# Patient Record
Sex: Male | Born: 1990 | Race: Black or African American | Hispanic: No | Marital: Single | Smoking: Former smoker
Health system: Southern US, Community
[De-identification: ages and names within clinical notes are randomized; demographics above are authoritative.]

---
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
## 2014-11-20 ENCOUNTER — Emergency Department (INDEPENDENT_AMBULATORY_CARE_PROVIDER_SITE_OTHER)
Admission: EM | Admit: 2014-11-20 | Discharge: 2014-11-20 | Disposition: A | Discharge status: Home / Self Care | Payer: Self-pay | Source: Home / Self Care | Attending: Emergency Medicine | Admitting: Emergency Medicine

## 2014-11-20 ENCOUNTER — Encounter (HOSPITAL_COMMUNITY): Payer: Self-pay | Admitting: Emergency Medicine

## 2014-11-20 DIAGNOSIS — L0231 Cutaneous abscess of buttock: Secondary | ICD-10-CM

## 2014-11-20 MED ORDER — LIDOCAINE HCL 2 % IJ SOLN
INTRAMUSCULAR | Status: AC
  Filled 2014-11-20: qty 20

## 2014-11-20 MED ORDER — LIDOCAINE-EPINEPHRINE (PF) 2 %-1:200000 IJ SOLN
INTRAMUSCULAR | Status: AC
  Filled 2014-11-20: qty 20

## 2014-11-20 MED ORDER — DOXYCYCLINE HYCLATE 100 MG PO CAPS: 100.0000 mg | ORAL_CAPSULE | Freq: Two times a day (BID) | ORAL | Status: DC

## 2014-11-20 NOTE — Discharge Instructions (Signed)
Ibuprofen as directed on packaging for pain.  Abscess An abscess is an infected area that contains a collection of pus and debris.It can occur in almost any part of the body. An abscess is also known as a furuncle or boil. CAUSES  An abscess occurs when tissue gets infected. This can occur from blockage of oil or sweat glands, infection of hair follicles, or a minor injury to the skin. As the body tries to fight the infection, pus collects in the area and creates pressure under the skin. This pressure causes pain. People with weakened immune systems have difficulty fighting infections and get certain abscesses more often.  SYMPTOMS Usually an abscess develops on the skin and becomes a painful mass that is red, warm, and tender. If the abscess forms under the skin, you may feel a moveable soft area under the skin. Some abscesses break open (rupture) on their own, but most will continue to get worse without care. The infection can spread deeper into the body and eventually into the bloodstream, causing you to feel ill.  DIAGNOSIS  Your caregiver will take your medical history and perform a physical exam. A sample of fluid may also be taken from the abscess to determine what is causing your infection. TREATMENT  Your caregiver may prescribe antibiotic medicines to fight the infection. However, taking antibiotics alone usually does not cure an abscess. Your caregiver may need to make a small cut (incision) in the abscess to drain the pus. In some cases, gauze is packed into the abscess to reduce pain and to continue draining the area. HOME CARE INSTRUCTIONS   Only take over-the-counter or prescription medicines for pain, discomfort, or fever as directed by your caregiver.  If you were prescribed antibiotics, take them as directed. Finish them even if you start to feel better.  If gauze is used, follow your caregiver's directions for changing the gauze.  To avoid spreading the infection:  Keep your  draining abscess covered with a bandage.  Wash your hands well.  Do not share personal care items, towels, or whirlpools with others.  Avoid skin contact with others.  Keep your skin and clothes clean around the abscess.  Keep all follow-up appointments as directed by your caregiver. SEEK MEDICAL CARE IF:   You have increased pain, swelling, redness, fluid drainage, or bleeding.  You have muscle aches, chills, or a general ill feeling.  You have a fever. MAKE SURE YOU:   Understand these instructions.  Will watch your condition.  Will get help right away if you are not doing well or get worse. Document Released: 09/24/2005 Document Revised: 06/15/2012 Document Reviewed: 02/27/2012 [facility] Patient Information 2015 [place], [place]. This information is not intended to replace advice given to you by your health care provider. Make sure you discuss any questions you have with your health care provider.  Incision and Drainage Incision and drainage is a procedure in which a sac-like structure (cystic structure) is opened and drained. The area to be drained usually contains material such as pus, fluid, or blood.  LET YOUR CAREGIVER KNOW ABOUT:   Allergies to medicine.  Medicines taken, including vitamins, herbs, eyedrops, over-the-counter medicines, and creams.  Use of steroids (by mouth or creams).  Previous problems with anesthetics or numbing medicines.  History of bleeding problems or blood clots.  Previous surgery.  Other health problems, including diabetes and kidney problems.  Possibility of pregnancy, if this applies. RISKS AND COMPLICATIONS  Pain.  Bleeding.  Scarring.  Infection. BEFORE  THE PROCEDURE  You may need to have an ultrasound or other imaging tests to see how large or deep your cystic structure is. Blood tests may also be used to determine if you have an infection or how severe the infection is. You may need to have a tetanus shot. PROCEDURE    The affected area is cleaned with a cleaning fluid. The cyst area will then be numbed with a medicine (local anesthetic). A small incision will be made in the cystic structure. A syringe or catheter may be used to drain the contents of the cystic structure, or the contents may be squeezed out. The area will then be flushed with a cleansing solution. After cleansing the area, it is often gently packed with a gauze or another wound dressing. Once it is packed, it will be covered with gauze and tape or some other type of wound dressing. AFTER THE PROCEDURE   Often, you will be allowed to go home right after the procedure.  You may be given antibiotic medicine to prevent or heal an infection.  If the area was packed with gauze or some other wound dressing, you will likely need to come back in 1 to 2 days to get it removed.  The area should heal in about 14 days. Document Released: 06/10/2001 Document Revised: 06/15/2012 Document Reviewed: 02/09/2012 [facility]Exit[facility] Patient Information 2015 [place], [place]. This information is not intended to replace advice given to you by your health care provider. Make sure you discuss any questions you have with your health care provider.

## 2014-11-20 NOTE — ED Notes (Signed)
Pt states that he has a boil on right buttock. Pt states that its been there for 3 days

## 2014-11-20 NOTE — ED Provider Notes (Signed)
CSN: [id number]     Arrival date & time 11/20/14  1101 History   First MD Initiated Contact with Patient 11/20/14 1216     Chief Complaint  Patient presents with  . Recurrent Skin Infections   (Consider location/radiation/quality/duration/timing/severity/associated sxs/prior Treatment) HPI Comments: Abscess on right buttock that began 3-5 days ago. Hx of same in past. No previous I&D. No resolution with use of Boil Ease at home. Reports himself to be otherwise healthy. PCP: none  The history is provided by the patient.    History reviewed. No pertinent past medical history. History reviewed. No pertinent past surgical history. History reviewed. No pertinent family history. History  Substance Use Topics  . Smoking status: Current Every Day Smoker -- 0.50 packs/day    Types: Cigarettes  . Smokeless tobacco: Not on file  . Alcohol Use: Yes    Review of Systems  All other systems reviewed and are negative.   Allergies  Review of patient's allergies indicates not on file.  Home Medications   Prior to Admission medications   Medication Sig Start Date End Date Taking? Authorizing Provider  doxycycline (VIBRAMYCIN) 100 MG capsule Take 1 capsule (100 mg total) by mouth 2 (two) times daily. X 7 days 11/20/14   Jess BartersJennifer Lee H Jibri Schriefer, PA   BP 131/80 mmHg  Pulse 72  Temp(Src) 98.1 F (36.7 C) (Oral)  Resp 16  SpO2 100% Physical Exam  Constitutional: He is oriented to person, place, and time. He appears well-developed and well-nourished. No distress.  HENT:  Head: Normocephalic and atraumatic.  Eyes: Conjunctivae are normal.  Cardiovascular: Normal rate.   Pulmonary/Chest: Effort normal.  Musculoskeletal: Normal range of motion.  Neurological: He is alert and oriented to person, place, and time.  Skin: Skin is warm and dry.  Round 3x3cm abscess at right medial buttock  Psychiatric: He has a normal mood and affect. His behavior is normal.  Nursing note and vitals  reviewed.   ED Course  INCISION AND DRAINAGE Date/Time: 11/20/2014 1:05 PM Performed by: Lemmie EvensPRESSON, Sheran Newstrom LEE H Authorized by: Leslee HomeKELLER, DAVID C Consent: Verbal consent obtained. Written consent not obtained. Risks and benefits: risks, benefits and alternatives were discussed Consent given by: patient Patient understanding: patient states understanding of the procedure being performed Patient identity confirmed: verbally with patient Time out: Immediately prior to procedure a "time out" was called to verify the correct patient, procedure, equipment, support staff and site/side marked as required. Type: abscess Body area: anogenital (right medial buttock) Anesthesia: local infiltration Local anesthetic: lidocaine 2% with epinephrine Anesthetic total: 2 ml Patient sedated: no Scalpel size: 11 Incision type: single straight Complexity: simple Drainage: purulent Drainage amount: copious Wound treatment: wound left open Packing material: none Patient tolerance: Patient tolerated the procedure well with no immediate complications   (including critical care time) Labs Review Labs Reviewed - No data to display  Imaging Review No results found.   MDM   1. Abscess of buttock, right    Patient voices understanding of wound care at home. Will place on 7 day course of doxycycline and advise follow up if condition does not improve. Warm compresses of sitz baths 3-4 x day until healed    Ria ClockJennifer Lee H Leith Hedlund, PA 11/20/14 1312

## 2015-07-07 ENCOUNTER — Encounter (HOSPITAL_COMMUNITY): Payer: Self-pay | Admitting: *Deleted

## 2015-07-07 ENCOUNTER — Emergency Department (INDEPENDENT_AMBULATORY_CARE_PROVIDER_SITE_OTHER)
Admission: EM | Admit: 2015-07-07 | Discharge: 2015-07-07 | Disposition: A | Discharge status: Home / Self Care | Payer: Self-pay | Source: Home / Self Care | Attending: Family Medicine | Admitting: Family Medicine

## 2015-07-07 DIAGNOSIS — K529 Noninfective gastroenteritis and colitis, unspecified: Secondary | ICD-10-CM

## 2015-07-07 MED ORDER — ONDANSETRON HCL 4 MG PO TABS: 4.0000 mg | ORAL_TABLET | Freq: Four times a day (QID) | ORAL | Status: DC

## 2015-07-07 MED ORDER — ONDANSETRON HCL 4 MG/2ML IJ SOLN
4.0000 mg | Freq: Once | INTRAMUSCULAR | Status: AC
  Administered 2015-07-07: 4 mg via INTRAVENOUS

## 2015-07-07 MED ORDER — SODIUM CHLORIDE 0.9 % IV BOLUS (SEPSIS)
1000.0000 mL | Freq: Once | INTRAVENOUS | Status: AC
  Administered 2015-07-07: 1000 mL via INTRAVENOUS

## 2015-07-07 MED ORDER — ONDANSETRON HCL 4 MG/2ML IJ SOLN
INTRAMUSCULAR | Status: AC
  Filled 2015-07-07: qty 2

## 2015-07-07 NOTE — Discharge Instructions (Signed)
Clear liquid , bland diet tonight as tolerated, advance on sun as improved, use medicine as needed, return or see your doctor if any problems.

## 2015-07-07 NOTE — ED Provider Notes (Signed)
CSN: [id number]     Arrival date & time 07/07/15  1536 History   First MD Initiated Contact with Patient 07/07/15 1558     Chief Complaint  Patient presents with  . Emesis   (Consider location/radiation/quality/duration/timing/severity/associated sxs/prior Treatment) Patient is a 24 y.o. male presenting with vomiting. The history is provided by the patient.  Emesis Severity:  Moderate Duration:  1 day Timing:  Intermittent Quality:  Stomach contents Progression:  Unchanged Chronicity:  New Context comment:  Drank [place] and beer last eve without food. Relieved by:  Nothing Worsened by:  Nothing tried Ineffective treatments:  None tried Associated symptoms: diarrhea   Associated symptoms: no fever     History reviewed. No pertinent past medical history. History reviewed. No pertinent past surgical history. History reviewed. No pertinent family history. History  Substance Use Topics  . Smoking status: Current Every Day Smoker -- 0.50 packs/day    Types: Cigarettes  . Smokeless tobacco: Not on file  . Alcohol Use: Yes    Review of Systems  Constitutional: Positive for appetite change. Negative for fever.  Gastrointestinal: Positive for nausea, vomiting and diarrhea. Negative for blood in stool and rectal pain.    Allergies  Review of patient's allergies indicates no known allergies.  Home Medications   Prior to Admission medications   Medication Sig Start Date End Date Taking? Authorizing Provider  doxycycline (VIBRAMYCIN) 100 MG capsule Take 1 capsule (100 mg total) by mouth 2 (two) times daily. X 7 days 11/20/14   Mathis FareJennifer Lee H Presson, PA  ondansetron (ZOFRAN) 4 MG tablet Take 1 tablet (4 mg total) by mouth every 6 (six) hours. Prn n/v 07/07/15   Linna HoffJames D Liddy Deam, MD   Temp(Src) 98.6 F (37 C) (Oral)  SpO2 100% Physical Exam  Constitutional: He appears well-developed and well-nourished.  HENT:  Mouth/Throat: Oropharynx is clear and moist.  Neck: Normal range of  motion. Neck supple.  Pulmonary/Chest: Effort normal and breath sounds normal.  Abdominal: Soft. Bowel sounds are normal. He exhibits no distension and no mass. There is tenderness. There is no rebound and no guarding.  Lymphadenopathy:    He has no cervical adenopathy.  Skin: Skin is warm and dry.  Nursing note and vitals reviewed.   ED Course  Procedures (including critical care time) Labs Review Labs Reviewed - No data to display  Imaging Review No results found.   MDM   1. Gastroenteritis, acute    Sx improved after ivf and meds at time of d/c.    Linna HoffJames D Terrace Chiem, MD 07/07/15 [phone]

## 2015-07-07 NOTE — ED Notes (Signed)
Pt  Reports  Symptoms  Of  Nausea  /  Vomiting /  Diarrhea          -  With  Onset  Of  Symptoms        Middle  Of  Last  Pm     vomited  Just  Prior  To  Arriving  At the  Clinic     - pt  Is  Awake  And  Alert  Skin  Is warm /  Dry  -  Not  Actively  Vomiting at this  Time

## 2017-03-03 ENCOUNTER — Emergency Department (HOSPITAL_COMMUNITY): Payer: Self-pay

## 2017-03-03 ENCOUNTER — Emergency Department (HOSPITAL_COMMUNITY)
Admission: EM | Admit: 2017-03-03 | Discharge: 2017-03-04 | Disposition: A | Discharge status: Home / Self Care | Payer: Self-pay | Attending: Emergency Medicine | Admitting: Emergency Medicine

## 2017-03-03 ENCOUNTER — Encounter (HOSPITAL_COMMUNITY): Payer: Self-pay

## 2017-03-03 DIAGNOSIS — F17210 Nicotine dependence, cigarettes, uncomplicated: Secondary | ICD-10-CM | POA: Insufficient documentation

## 2017-03-03 DIAGNOSIS — IMO0002 Reserved for concepts with insufficient information to code with codable children: Secondary | ICD-10-CM

## 2017-03-03 DIAGNOSIS — N5089 Other specified disorders of the male genital organs: Secondary | ICD-10-CM

## 2017-03-03 DIAGNOSIS — N453 Epididymo-orchitis: Secondary | ICD-10-CM | POA: Insufficient documentation

## 2017-03-03 DIAGNOSIS — R229 Localized swelling, mass and lump, unspecified: Secondary | ICD-10-CM

## 2017-03-03 LAB — CBC
HEMATOCRIT: 40.6 % (ref 39.0–52.0)
Hemoglobin: 13.1 g/dL (ref 13.0–17.0)
MCH: 30.9 pg (ref 26.0–34.0)
MCHC: 32.3 g/dL (ref 30.0–36.0)
MCV: 95.8 fL (ref 78.0–100.0)
Platelets: 283 10*3/uL (ref 150–400)
RBC: 4.24 MIL/uL (ref 4.22–5.81)
RDW: 12.5 % (ref 11.5–15.5)
WBC: 14.1 10*3/uL — ABNORMAL HIGH (ref 4.0–10.5)

## 2017-03-03 LAB — URINALYSIS, ROUTINE W REFLEX MICROSCOPIC
Bilirubin Urine: NEGATIVE
Glucose, UA: NEGATIVE mg/dL
HGB URINE DIPSTICK: NEGATIVE
Ketones, ur: NEGATIVE mg/dL
NITRITE: NEGATIVE
PROTEIN: NEGATIVE mg/dL
SPECIFIC GRAVITY, URINE: 1.027 (ref 1.005–1.030)
pH: 5 (ref 5.0–8.0)

## 2017-03-03 LAB — COMPREHENSIVE METABOLIC PANEL
ALBUMIN: 4.2 g/dL (ref 3.5–5.0)
ALK PHOS: 66 U/L (ref 38–126)
ALT: 15 U/L — ABNORMAL LOW (ref 17–63)
AST: 21 U/L (ref 15–41)
Anion gap: 10 (ref 5–15)
BILIRUBIN TOTAL: 0.7 mg/dL (ref 0.3–1.2)
BUN: 10 mg/dL (ref 6–20)
CO2: 23 mmol/L (ref 22–32)
Calcium: 9.5 mg/dL (ref 8.9–10.3)
Chloride: 104 mmol/L (ref 101–111)
Creatinine, Ser: 0.98 mg/dL (ref 0.61–1.24)
GFR calc Af Amer: >60 mL/min (ref >60)
GFR calc non Af Amer: >60 mL/min (ref >60)
GLUCOSE: 83 mg/dL (ref 65–99)
POTASSIUM: 3.7 mmol/L (ref 3.5–5.1)
SODIUM: 137 mmol/L (ref 135–145)
TOTAL PROTEIN: 7.2 g/dL (ref 6.5–8.1)

## 2017-03-03 LAB — LIPASE, BLOOD: Lipase: 22 U/L (ref 11–51)

## 2017-03-03 MED ORDER — NAPROXEN 250 MG PO TABS
500.0000 mg | ORAL_TABLET | Freq: Once | ORAL | Status: AC
  Administered 2017-03-03: 500 mg via ORAL
  Filled 2017-03-03: qty 2

## 2017-03-03 MED ORDER — OXYCODONE-ACETAMINOPHEN 5-325 MG PO TABS
1.0000 | ORAL_TABLET | Freq: Once | ORAL | Status: AC
  Administered 2017-03-03: 1 via ORAL
  Filled 2017-03-03: qty 1

## 2017-03-03 NOTE — ED Triage Notes (Signed)
Onset 1 week ago pt jumped into seat of fathers PU truck and sat on right testicle, since then right testicle pain and swelling, and lower right abd pain.  Urinating WNl.

## 2017-03-03 NOTE — ED Notes (Signed)
Patient transported to Ultrasound 

## 2017-03-03 NOTE — ED Provider Notes (Signed)
[facility]-EMERGENCY DEPT Provider Note   CSN: [id number] Arrival date & time: 03/03/17  2020     History   Chief Complaint Chief Complaint  Patient presents with  . Testicle Pain  . Abdominal Pain    HPI Stephen Gutierrez is a 26 y.o. male.  HPI Presents after an injury to his right testicle States he was going into his father's truck when he sat on to his testicle abruptly and hurt it He noted that it has been more swollen than usual since He denies that the pain was present prior to this But since then, he has had significant pain in his right testicle and this has now started to radiate up his right upper quadrant He is not vomiting, not nauseous He denies any fevers, chills He is otherwise healthy He attempted over-the-counter medications including Tylenol, NSAIDs, warm bath but does not feel significant improvement in the pain continues The pain is constant, severe He feels like his right testicle is swollen He states he is sexually active but has not had in her course in 3 weeks and he has the same partner He has had STIs but this was when he was a teenager Denies urethral discharge  History reviewed. No pertinent past medical history.  There are no active problems to display for this patient.   History reviewed. No pertinent surgical history.     Home Medications    Prior to Admission medications   Medication Sig Start Date End Date Taking? Authorizing Provider  doxycycline (VIBRAMYCIN) 100 MG capsule Take 1 capsule (100 mg total) by mouth 2 (two) times daily. X 7 days 11/20/14   Mathis Fare Presson, PA  ondansetron (ZOFRAN) 4 MG tablet Take 1 tablet (4 mg total) by mouth every 6 (six) hours. Prn n/v 07/07/15   Linna Hoff, MD    Family History History reviewed. No pertinent family history.  Social History Social History  Substance Use Topics  . Smoking status: Current Every Day Smoker    Packs/day: 0.25    Types: Cigarettes  . Smokeless tobacco: Never  Used  . Alcohol use Yes     Allergies   Patient has no known allergies.   Review of Systems Review of Systems  Constitutional: Negative for fever.  Allergic/Immunologic: Negative for immunocompromised state.  All other systems reviewed and are negative.    Physical Exam Updated Vital Signs BP 137/95   Pulse 90   Temp 99.1 F (37.3 C) (Oral)   Resp 16   Ht 5\' 10"  (1.778 m)   Wt 59 kg   SpO2 99%   BMI 18.65 kg/m   Physical Exam  Constitutional: He appears well-developed and well-nourished. No distress.  HENT:  Head: Normocephalic and atraumatic.  Eyes: Conjunctivae are normal. Pupils are equal, round, and reactive to light. Right eye exhibits no discharge. Left eye exhibits no discharge.  Neck: Normal range of motion. Neck supple.  Cardiovascular: Normal rate and regular rhythm.   No murmur heard. Pulmonary/Chest: Effort normal and breath sounds normal. No respiratory distress.  Abdominal: Soft. Bowel sounds are normal. He exhibits no distension and no mass. There is no tenderness. There is no rebound and no guarding. A hernia is present.  Genitourinary: No penile tenderness.  Genitourinary Comments: Right testicle is significantly larger then left, tender without fluctuant mass Pos cremasteric reflex bilaterally Minimal inguinal hernia bilaterally that is fully reducible without tenderness  Musculoskeletal: He exhibits no edema.  Neurological: He is alert.  Skin: Skin is  warm. He is not diaphoretic.  Psychiatric: He has a normal mood and affect.  Nursing note and vitals reviewed.  ED Treatments / Results  Labs (all labs ordered are listed, but only abnormal results are displayed) Labs Reviewed  COMPREHENSIVE METABOLIC PANEL - Abnormal; Notable for the following:       Result Value   ALT 15 (*)    All other components within normal limits  CBC - Abnormal; Notable for the following:    WBC 14.1 (*)    All other components within normal limits  URINALYSIS,  ROUTINE W REFLEX MICROSCOPIC - Abnormal; Notable for the following:    APPearance HAZY (*)    Leukocytes, UA MODERATE (*)    Bacteria, UA RARE (*)    Squamous Epithelial / LPF 0-5 (*)    All other components within normal limits  URINE CULTURE  LIPASE, BLOOD  GC/CHLAMYDIA PROBE AMP ([place]) NOT AT [facility]    EKG  EKG Interpretation None       Radiology US Scrotum  Result Date: 03/04/2017 CLINICAL DATA:  Right testicular pain and swelling for 1 week EXAM: SCROTAL ULTRASOUND DOPPLER ULTRASOUND OF THE TESTICLES TECHNIQUE: Complete ultrasound examination of the testicles, epididymis, and other scrotal structures was performed. Color and spectral Doppler ultrasound were also utilized to evaluate blood flow to the testicles. COMPARISON:  None. FINDINGS: Right testicle Measurements: 4.1 x 2.9 x 3.0 cm. No mass or microlithiasis visualized. Left testicle Measurements: 4.0 x 2.2 x 2.8 cm. No mass or microlithiasis visualized. Right epididymis: Edematous without focal lesion. Markedly hyperemic on Doppler. Left epididymis: Normal in size and appearance. Incidental 3 mm cyst. Hydrocele:  Moderately large mildly complex right hydrocele. Varicocele:  None visualized. Pulsed Doppler interrogation of both testes demonstrates marked hypervascularity of the right testis and right epididymis. Normal perfusion on the left. IMPRESSION: Marked hypervascularity of the right testis and right epididymis, consistent with epididymo-orchitis. Mildly complex moderately large right hydrocele. No evidence of testicular mass or torsion. Electronically Signed   By: Ellery Plunk M.D.   On: 03/04/2017 00:12   [place] Art/ven Flow Abd Pelv Doppler  Result Date: 03/04/2017 CLINICAL DATA:  Right testicular pain and swelling for 1 week EXAM: SCROTAL ULTRASOUND DOPPLER ULTRASOUND OF THE TESTICLES TECHNIQUE: Complete ultrasound examination of the testicles, epididymis, and other scrotal structures was performed. Color and spectral  Doppler ultrasound were also utilized to evaluate blood flow to the testicles. COMPARISON:  None. FINDINGS: Right testicle Measurements: 4.1 x 2.9 x 3.0 cm. No mass or microlithiasis visualized. Left testicle Measurements: 4.0 x 2.2 x 2.8 cm. No mass or microlithiasis visualized. Right epididymis: Edematous without focal lesion. Markedly hyperemic on Doppler. Left epididymis: Normal in size and appearance. Incidental 3 mm cyst. Hydrocele:  Moderately large mildly complex right hydrocele. Varicocele:  None visualized. Pulsed Doppler interrogation of both testes demonstrates marked hypervascularity of the right testis and right epididymis. Normal perfusion on the left. IMPRESSION: Marked hypervascularity of the right testis and right epididymis, consistent with epididymo-orchitis. Mildly complex moderately large right hydrocele. No evidence of testicular mass or torsion. Electronically Signed   By: Ellery Plunk M.D.   On: 03/04/2017 00:12    Procedures Procedures (including critical care time)  Medications Ordered in ED Medications  oxyCODONE-acetaminophen (PERCOCET/ROXICET) 5-325 MG per tablet 1 tablet (1 tablet Oral Given 03/03/17 2252)  naproxen (NAPROSYN) tablet 500 mg (500 mg Oral Given 03/03/17 2252)  acetaminophen (TYLENOL) tablet 650 mg (650 mg Oral Given 03/04/17 0134)  oxyCODONE (Oxy  IR/ROXICODONE) immediate release tablet 5 mg (5 mg Oral Given 03/04/17 0134)     Initial Impression / Assessment and Plan / ED Course  I have reviewed the triage vital signs and the nursing notes.  Pertinent labs & imaging results that were available during my care of the patient were reviewed by me and considered in my medical decision making (see chart for details).     Ultrasound reveals epididymal orchitis the patient has a white count with some mild leukocytes on urinalysis.  Patient's left lower quadrant and right lower quadrant abdomen are benign and patient has no evidence of a incarcerated hernia.   Patient is not high risk for STDs but culture sent.  Discussed with urologist, Dr.Herrck, who recommends ice, high-dose ibuprofen scheduled over 2 weeks and as needed follow-up with urology.  Discussed with patient diagnosis, treatment, return precautions and he verbalizes understanding and agreement with plan.  Patient discharged in good condition.  Final Clinical Impressions(s) / ED Diagnoses   Final diagnoses:  Mass  Testicular swelling  Orchitis and epididymitis    New Prescriptions New Prescriptions   No medications on file     Sidney AceAlison Charruf Sharmane Dame, MD 03/04/17 0149    Laurence Spatesachel Morgan Little, MD 03/06/17 1400

## 2017-03-04 MED ORDER — IBUPROFEN 800 MG PO TABS: 800.0000 mg | ORAL_TABLET | Freq: Three times a day (TID) | ORAL | 0 refills | Status: DC

## 2017-03-04 MED ORDER — OXYCODONE HCL 5 MG PO TABS
5.0000 mg | ORAL_TABLET | Freq: Once | ORAL | Status: AC
  Administered 2017-03-04: 5 mg via ORAL
  Filled 2017-03-04: qty 1

## 2017-03-04 MED ORDER — ACETAMINOPHEN 325 MG PO TABS
650.0000 mg | ORAL_TABLET | Freq: Once | ORAL | Status: AC
  Administered 2017-03-04: 650 mg via ORAL
  Filled 2017-03-04: qty 2

## 2017-03-04 NOTE — ED Notes (Signed)
EDP at bedside  

## 2017-03-04 NOTE — ED Notes (Signed)
Pt requesting pain medication and coke to drink. Alona BeneJoyce, RN notified.

## 2017-03-05 LAB — URINE CULTURE

## 2017-12-03 ENCOUNTER — Encounter (HOSPITAL_COMMUNITY): Payer: Self-pay | Admitting: Emergency Medicine

## 2017-12-03 ENCOUNTER — Emergency Department (HOSPITAL_COMMUNITY)
Admission: EM | Admit: 2017-12-03 | Discharge: 2017-12-03 | Disposition: A | Discharge status: Home / Self Care | Payer: Self-pay | Attending: Emergency Medicine | Admitting: Emergency Medicine

## 2017-12-03 ENCOUNTER — Other Ambulatory Visit: Payer: Self-pay

## 2017-12-03 DIAGNOSIS — F1910 Other psychoactive substance abuse, uncomplicated: Secondary | ICD-10-CM

## 2017-12-03 DIAGNOSIS — T43641A Poisoning by ecstasy, accidental (unintentional), initial encounter: Secondary | ICD-10-CM | POA: Insufficient documentation

## 2017-12-03 DIAGNOSIS — F1210 Cannabis abuse, uncomplicated: Secondary | ICD-10-CM | POA: Insufficient documentation

## 2017-12-03 DIAGNOSIS — F17210 Nicotine dependence, cigarettes, uncomplicated: Secondary | ICD-10-CM | POA: Insufficient documentation

## 2017-12-03 LAB — CBC WITH DIFFERENTIAL/PLATELET
Basophils Absolute: 0 10*3/uL (ref 0.0–0.1)
Basophils Relative: 0 %
EOS PCT: 0 %
Eosinophils Absolute: 0 10*3/uL (ref 0.0–0.7)
HCT: 40 % (ref 39.0–52.0)
Hemoglobin: 13 g/dL (ref 13.0–17.0)
LYMPHS ABS: 1.6 10*3/uL (ref 0.7–4.0)
LYMPHS PCT: 18 %
MCH: 31.8 pg (ref 26.0–34.0)
MCHC: 32.5 g/dL (ref 30.0–36.0)
MCV: 97.8 fL (ref 78.0–100.0)
MONO ABS: 0.8 10*3/uL (ref 0.1–1.0)
MONOS PCT: 9 %
Neutro Abs: 6.4 10*3/uL (ref 1.7–7.7)
Neutrophils Relative %: 73 %
PLATELETS: 298 10*3/uL (ref 150–400)
RBC: 4.09 MIL/uL — ABNORMAL LOW (ref 4.22–5.81)
RDW: 13.6 % (ref 11.5–15.5)
WBC: 8.8 10*3/uL (ref 4.0–10.5)

## 2017-12-03 LAB — COMPREHENSIVE METABOLIC PANEL
ALT: 20 U/L (ref 17–63)
AST: 29 U/L (ref 15–41)
Albumin: 3.6 g/dL (ref 3.5–5.0)
Alkaline Phosphatase: 69 U/L (ref 38–126)
Anion gap: 5 (ref 5–15)
BILIRUBIN TOTAL: 0.6 mg/dL (ref 0.3–1.2)
BUN: 10 mg/dL (ref 6–20)
CALCIUM: 8.9 mg/dL (ref 8.9–10.3)
CHLORIDE: 107 mmol/L (ref 101–111)
CO2: 27 mmol/L (ref 22–32)
CREATININE: 0.89 mg/dL (ref 0.61–1.24)
Glucose, Bld: 103 mg/dL — ABNORMAL HIGH (ref 65–99)
POTASSIUM: 3.8 mmol/L (ref 3.5–5.1)
Sodium: 139 mmol/L (ref 135–145)
TOTAL PROTEIN: 6.2 g/dL — AB (ref 6.5–8.1)

## 2017-12-03 LAB — ACETAMINOPHEN LEVEL: Acetaminophen (Tylenol), Serum: <10 ug/mL — ABNORMAL LOW (ref 10–30)

## 2017-12-03 LAB — SALICYLATE LEVEL

## 2017-12-03 LAB — ETHANOL

## 2017-12-03 NOTE — ED Triage Notes (Signed)
Per EMS-states he and his roommate took Ectasy around 9 pm last night-patient was uncooperative with EMS-5 mg of Versed given in right deltoid-able to follow simple commands at this time

## 2017-12-03 NOTE — ED Notes (Signed)
Bed: [facility] Expected date:  Expected time:  Means of arrival:  Comments: 
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
## 2017-12-03 NOTE — Discharge Instructions (Signed)
Don't do Ecstasy again.  Increase hydration.   Return to ER for new or worsening symptoms, any additional concerns.

## 2019-01-12 ENCOUNTER — Emergency Department (HOSPITAL_COMMUNITY)
Admission: EM | Admit: 2019-01-12 | Discharge: 2019-01-12 | Disposition: A | Discharge status: Home / Self Care | Payer: Self-pay | Attending: Emergency Medicine | Admitting: Emergency Medicine

## 2019-01-12 ENCOUNTER — Emergency Department (HOSPITAL_COMMUNITY): Payer: Self-pay

## 2019-01-12 DIAGNOSIS — S0101XA Laceration without foreign body of scalp, initial encounter: Secondary | ICD-10-CM | POA: Insufficient documentation

## 2019-01-12 DIAGNOSIS — S0990XA Unspecified injury of head, initial encounter: Secondary | ICD-10-CM

## 2019-01-12 DIAGNOSIS — F17210 Nicotine dependence, cigarettes, uncomplicated: Secondary | ICD-10-CM | POA: Insufficient documentation

## 2019-01-12 DIAGNOSIS — S022XXA Fracture of nasal bones, initial encounter for closed fracture: Secondary | ICD-10-CM | POA: Insufficient documentation

## 2019-01-12 DIAGNOSIS — Y939 Activity, unspecified: Secondary | ICD-10-CM | POA: Insufficient documentation

## 2019-01-12 DIAGNOSIS — Y998 Other external cause status: Secondary | ICD-10-CM | POA: Insufficient documentation

## 2019-01-12 DIAGNOSIS — Y929 Unspecified place or not applicable: Secondary | ICD-10-CM | POA: Insufficient documentation

## 2019-01-12 MED ORDER — HYDROCODONE-ACETAMINOPHEN 5-325 MG PO TABS: 1.0000 | ORAL_TABLET | ORAL | 0 refills | Status: DC | PRN

## 2019-01-12 MED ORDER — FLUORESCEIN SODIUM 1 MG OP STRP
1.0000 | ORAL_STRIP | Freq: Once | OPHTHALMIC | Status: AC
  Administered 2019-01-12: 1 via OPHTHALMIC
  Filled 2019-01-12: qty 1

## 2019-01-12 MED ORDER — TETRACAINE HCL 0.5 % OP SOLN
1.0000 [drp] | Freq: Once | OPHTHALMIC | Status: AC
  Administered 2019-01-12: 2 [drp] via OPHTHALMIC
  Filled 2019-01-12: qty 4
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
## 2019-01-12 NOTE — Discharge Instructions (Signed)
Please return for any problem.  Follow-up with your regular care providers as instructed.  The staple in your scalp will need to be removed in 5 to 7 days.  You can return to the ED for removal.

## 2019-01-12 NOTE — ED Notes (Signed)
EDP at bedside for eye exam.  

## 2019-01-17 ENCOUNTER — Emergency Department (HOSPITAL_COMMUNITY)
Admission: EM | Admit: 2019-01-17 | Discharge: 2019-01-17 | Disposition: A | Discharge status: Home / Self Care | Payer: Self-pay | Attending: Emergency Medicine | Admitting: Emergency Medicine

## 2019-01-17 DIAGNOSIS — Z5189 Encounter for other specified aftercare: Secondary | ICD-10-CM

## 2019-01-17 DIAGNOSIS — F17210 Nicotine dependence, cigarettes, uncomplicated: Secondary | ICD-10-CM | POA: Insufficient documentation

## 2019-01-17 DIAGNOSIS — Z4802 Encounter for removal of sutures: Secondary | ICD-10-CM

## 2019-01-17 NOTE — Discharge Instructions (Addendum)
You have been seen today for a wound check. Please read and follow all provided instructions.   1. Medications: usual home medications 2. Treatment: rest, drink plenty of fluids 3. Follow Up: Please follow up with your primary doctor in 5-7 days for discussion of your diagnoses and further evaluation after today's visit; if you do not have a primary care doctor use the resource guide provided to find one; Please return to the ER for any new or worsening symptoms. Please obtain all of your results from medical records or have your doctors office obtain the results - share them with your doctor - you should be seen at your doctors office. Call today to arrange your follow up.   Take medications as prescribed. Please review all of the medicines and only take them if you do not have an allergy to them. Return to the emergency room for worsening condition or new concerning symptoms. Follow up with your regular doctor. If you don't have a regular doctor use one of the numbers below to establish a primary care doctor.  Please be aware that if you are taking birth control pills, taking other prescriptions, ESPECIALLY ANTIBIOTICS may make the birth control ineffective - if this is the case, either do not engage in sexual activity or use alternative methods of birth control such as condoms until you have finished the medicine and your family doctor says it is OK to restart them. If you are on a blood thinner such as COUMADIN, be aware that any other medicine that you take may cause the coumadin to either work too much, or not enough - you should have your coumadin level rechecked in next 7 days if this is the case.  ?  It is also a possibility that you have an allergic reaction to any of the medicines that you have been prescribed - Everybody reacts differently to medications and while MOST people have no trouble with most medicines, you may have a reaction such as nausea, vomiting, rash, swelling, shortness of  breath. If this is the case, please stop taking the medicine immediately and contact your physician.  ?  You should return to the ER if you develop severe or worsening symptoms.   Emergency Department Resource Guide 1) Find a Doctor and Pay Out of Pocket Although you won't have to find out who is covered by your insurance plan, it is a good idea to ask around and get recommendations. You will then need to call the office and see if the doctor you have chosen will accept you as a new patient and what types of options they offer for patients who are self-pay. Some doctors offer discounts or will set up payment plans for their patients who do not have insurance, but you will need to ask so you aren't surprised when you get to your appointment.  2) Contact Your Local Health Department Not all health departments have doctors that can see patients for sick visits, but many do, so it is worth a call to see if yours does. If you don't know where your local health department is, you can check in your phone book. The CDC also has a tool to help you locate your state's health department, and many state websites also have listings of all of their local health departments.  3) Find a Walk-in Clinic If your illness is not likely to be very severe or complicated, you may want to try a walk in clinic. These are popping up all  over the country in pharmacies, drugstores, and shopping centers. They're usually staffed by nurse practitioners or physician assistants that have been trained to treat common illnesses and complaints. They're usually fairly quick and inexpensive. However, if you have serious medical issues or chronic medical problems, these are probably not your best option.  No Primary Care Doctor: Call Health Connect at  [phone] - they can help you locate a primary care doctor that  accepts your insurance, provides certain services, etc. Physician Referral [phone]  Emergency Department  Resource Guide 1) Find a Doctor and Pay Out of Pocket Although you won't have to find out who is covered by your insurance plan, it is a good idea to ask around and get recommendations. You will then need to call the office and see if the doctor you have chosen will accept you as a new patient and what types of options they offer for patients who are self-pay. Some doctors offer discounts or will set up payment plans for their patients who do not have insurance, but you will need to ask so you aren't surprised when you get to your appointment.  2) Contact Your Local Health Department Not all health departments have doctors that can see patients for sick visits, but many do, so it is worth a call to see if yours does. If you don't know where your local health department is, you can check in your phone book. The CDC also has a tool to help you locate your state's health department, and many state websites also have listings of all of their local health departments.  3) Find a [facility] If your illness is not likely to be very severe or complicated, you may want to try a walk in clinic. These are popping up all over the country in pharmacies, drugstores, and shopping centers. They're usually staffed by nurse practitioners or physician assistants that have been trained to treat common illnesses and complaints. They're usually fairly quick and inexpensive. However, if you have serious medical issues or chronic medical problems, these are probably not your best option.  No Primary Care Doctor: Call Health Connect at  [phone] - they can help you locate a primary care doctor that  accepts your insurance, provides certain services, etc. Physician Referral Service- [phone]  Chronic Pain Problems: Organization         Address  Phone   Notes  [facility]  [phone] Patients need to be referred by their primary care doctor.   Medication Assistance: Organization          Address  Phone   Notes  [facility] [address] [place], [place] [address] [phone] --Must be a resident of [facility] -- Must have NO insurance coverage whatsoever (no Medicaid/ Medicare, etc.) -- The pt. MUST have a primary care doctor that directs their care regularly and follows them in the community   [facility]  [phone]   [employer]  [phone]    Agencies that provide inexpensive medical care: Organization         Address  Phone   Notes  [place]  [phone]   [facility]    [phone]   [facility] [place], [place] [address] [phone]   [place] [address] [place] [phone]   [facility]anned Parenthood    [phone]  [facility]    [phone]   [facility] and [facility]  [address], [place] Phone:  [phone], Fax:  [phone] Hours of Operation:  9 am - 6 pm, M-F.  Also accepts Medicaid/Medicare and self-pay.  [facility] for [place] [address], [address], [place] Phone: [phone], Fax: [phone]. Hours of Operation:  8:30 am - 5:30 pm, M-F.  Also accepts Medicaid and self-pay.  [facility] [address] [place] Phone: [phone]   [place], [place], [place] [phone], Ext. [phone] Mondays & Thursdays: 7-9 AM.  First 15 patients are seen on a first come, first serve basis.    [place] Providers:  Organization         Address  Phone   Notes  [facility] [address], [place] [phone] Also accepts self-pay patients.  [facility] [address], [place]  [phone]   [place], [address], [place] [phone]   [facility] [address] [place] [phone]   Lucianne Lei [address], [place]   [phone] Only accepts [place] [facility] [place] patients after they have their name applied to their card.   Self-Pay (no insurance) in [facility]:  Organization         Address  Phone   Notes  Sickle Cell Patients, [facility] Internal [facility] Protection [phone]   [facility] [place] [phone]   [facility] [place]  [place], [place], [place] [phone]   [facility]/Dr. Osei-Bonsu  [address] [place] or [address], [place] [phone] Phone number for both [place] and [place] locations is the same.  [facility] and [facility] [address] [place] [phone]   [facility] [address], [place] or [address] [phone]   [facility] [address], [place] [phone], phone; [phone], fax Sees patients 1st and 3rd Saturday of every month.  Must not qualify for public or private insurance (i.e. Medicaid, Medicare,  Health Choice, Veterans' Benefits)  Household income should be no more than 200% of the poverty level The clinic cannot treat you if you are pregnant or think you are pregnant  Sexually transmitted diseases are not treated at the clinic.
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
## 2019-01-17 NOTE — ED Triage Notes (Signed)
Pt arrives to have one staple removed from right side of head.

## 2019-05-21 ENCOUNTER — Emergency Department (HOSPITAL_COMMUNITY): Payer: Self-pay

## 2019-05-21 ENCOUNTER — Encounter (HOSPITAL_COMMUNITY): Payer: Self-pay | Admitting: Emergency Medicine

## 2019-05-21 ENCOUNTER — Emergency Department (HOSPITAL_COMMUNITY): Payer: Self-pay | Admitting: Certified Registered Nurse Anesthetist

## 2019-05-21 ENCOUNTER — Inpatient Hospital Stay (HOSPITAL_COMMUNITY)
Admission: EM | Admit: 2019-05-21 | Discharge: 2019-05-22 | DRG: 030 | Disposition: A | Discharge status: Home / Self Care | Payer: Self-pay | Attending: Neurosurgery | Admitting: Neurosurgery

## 2019-05-21 ENCOUNTER — Other Ambulatory Visit: Payer: Self-pay

## 2019-05-21 ENCOUNTER — Inpatient Hospital Stay (HOSPITAL_COMMUNITY): Payer: Self-pay

## 2019-05-21 ENCOUNTER — Encounter (HOSPITAL_COMMUNITY)
Admission: EM | Disposition: A | Discharge status: Home / Self Care | Payer: Self-pay | Source: Home / Self Care | Attending: Neurosurgery

## 2019-05-21 DIAGNOSIS — F17210 Nicotine dependence, cigarettes, uncomplicated: Secondary | ICD-10-CM | POA: Diagnosis present

## 2019-05-21 DIAGNOSIS — S14155A Other incomplete lesion at C5 level of cervical spinal cord, initial encounter: Principal | ICD-10-CM | POA: Diagnosis present

## 2019-05-21 DIAGNOSIS — M4842XA Fatigue fracture of vertebra, cervical region, initial encounter for fracture: Secondary | ICD-10-CM | POA: Diagnosis present

## 2019-05-21 DIAGNOSIS — Z419 Encounter for procedure for purposes other than remedying health state, unspecified: Secondary | ICD-10-CM

## 2019-05-21 DIAGNOSIS — M532X2 Spinal instabilities, cervical region: Secondary | ICD-10-CM

## 2019-05-21 DIAGNOSIS — M542 Cervicalgia: Secondary | ICD-10-CM

## 2019-05-21 DIAGNOSIS — S14109A Unspecified injury at unspecified level of cervical spinal cord, initial encounter: Secondary | ICD-10-CM | POA: Diagnosis present

## 2019-05-21 DIAGNOSIS — Z20828 Contact with and (suspected) exposure to other viral communicable diseases: Secondary | ICD-10-CM | POA: Diagnosis present

## 2019-05-21 DIAGNOSIS — M25572 Pain in left ankle and joints of left foot: Secondary | ICD-10-CM

## 2019-05-21 DIAGNOSIS — S12400A Unspecified displaced fracture of fifth cervical vertebra, initial encounter for closed fracture: Secondary | ICD-10-CM | POA: Diagnosis present

## 2019-05-21 DIAGNOSIS — S0990XA Unspecified injury of head, initial encounter: Secondary | ICD-10-CM

## 2019-05-21 HISTORY — PX: ANTERIOR CERVICAL DECOMP/DISCECTOMY FUSION: SHX1161

## 2019-05-21 LAB — COMPREHENSIVE METABOLIC PANEL
ALT: 21 U/L (ref 0–44)
AST: 39 U/L (ref 15–41)
Albumin: 5 g/dL (ref 3.5–5.0)
Alkaline Phosphatase: 57 U/L (ref 38–126)
Anion gap: 10 (ref 5–15)
BUN: 6 mg/dL (ref 6–20)
CO2: 25 mmol/L (ref 22–32)
Calcium: 9.7 mg/dL (ref 8.9–10.3)
Chloride: 102 mmol/L (ref 98–111)
Creatinine, Ser: 1.05 mg/dL (ref 0.61–1.24)
GFR calc Af Amer: >60 mL/min (ref >60)
GFR calc non Af Amer: >60 mL/min (ref >60)
Glucose, Bld: 93 mg/dL (ref 70–99)
Potassium: 4.1 mmol/L (ref 3.5–5.1)
Sodium: 137 mmol/L (ref 135–145)
Total Bilirubin: 0.9 mg/dL (ref 0.3–1.2)
Total Protein: 8.2 g/dL — ABNORMAL HIGH (ref 6.5–8.1)

## 2019-05-21 LAB — SARS CORONAVIRUS 2 BY RT PCR (HOSPITAL ORDER, PERFORMED IN ~~LOC~~ HOSPITAL LAB): SARS Coronavirus 2: NEGATIVE

## 2019-05-21 LAB — CBC WITH DIFFERENTIAL/PLATELET
Abs Immature Granulocytes: 0.11 10*3/uL — ABNORMAL HIGH (ref 0.00–0.07)
Basophils Absolute: 0 10*3/uL (ref 0.0–0.1)
Basophils Relative: 0 %
Eosinophils Absolute: 0 10*3/uL (ref 0.0–0.5)
Eosinophils Relative: 0 %
HCT: 43.7 % (ref 39.0–52.0)
Hemoglobin: 14.4 g/dL (ref 13.0–17.0)
Immature Granulocytes: 1 %
Lymphocytes Relative: 5 %
Lymphs Abs: 0.8 10*3/uL (ref 0.7–4.0)
MCH: 32.1 pg (ref 26.0–34.0)
MCHC: 33 g/dL (ref 30.0–36.0)
MCV: 97.3 fL (ref 80.0–100.0)
Monocytes Absolute: 1 10*3/uL (ref 0.1–1.0)
Monocytes Relative: 5 %
Neutro Abs: 16.9 10*3/uL — ABNORMAL HIGH (ref 1.7–7.7)
Neutrophils Relative %: 89 %
Platelets: 223 10*3/uL (ref 150–400)
RBC: 4.49 MIL/uL (ref 4.22–5.81)
RDW: 12.5 % (ref 11.5–15.5)
WBC: 18.9 10*3/uL — ABNORMAL HIGH (ref 4.0–10.5)
nRBC: 0 % (ref 0.0–0.2)

## 2019-05-21 LAB — ABO/RH: ABO/RH(D): O POS

## 2019-05-21 LAB — PROTIME-INR
INR: 1.1 (ref 0.8–1.2)
Prothrombin Time: 14.1 seconds (ref 11.4–15.2)

## 2019-05-21 LAB — TYPE AND SCREEN
ABO/RH(D): O POS
Antibody Screen: NEGATIVE

## 2019-05-21 LAB — ETHANOL: Alcohol, Ethyl (B): <10 mg/dL (ref <10)

## 2019-05-21 SURGERY — ANTERIOR CERVICAL DECOMPRESSION/DISCECTOMY FUSION 1 LEVEL
Anesthesia: General | Site: Neck

## 2019-05-21 MED ORDER — FENTANYL CITRATE (PF) 250 MCG/5ML IJ SOLN
INTRAMUSCULAR | Status: AC
  Filled 2019-05-21: qty 5

## 2019-05-21 MED ORDER — ROCURONIUM BROMIDE 50 MG/5ML IV SOSY
PREFILLED_SYRINGE | INTRAVENOUS | Status: DC | PRN
  Administered 2019-05-21: 50 mg via INTRAVENOUS

## 2019-05-21 MED ORDER — PROMETHAZINE HCL 25 MG/ML IJ SOLN: 6.2500 mg | INTRAMUSCULAR | Status: DC | PRN

## 2019-05-21 MED ORDER — FENTANYL CITRATE (PF) 100 MCG/2ML IJ SOLN: 25.0000 ug | INTRAMUSCULAR | Status: DC | PRN

## 2019-05-21 MED ORDER — ACETAMINOPHEN 10 MG/ML IV SOLN
INTRAVENOUS | Status: AC
  Filled 2019-05-21: qty 100

## 2019-05-21 MED ORDER — SUGAMMADEX SODIUM 200 MG/2ML IV SOLN
INTRAVENOUS | Status: DC | PRN
  Administered 2019-05-21: 200 mg via INTRAVENOUS

## 2019-05-21 MED ORDER — THROMBIN 5000 UNITS EX SOLR
OROMUCOSAL | Status: DC | PRN
  Administered 2019-05-21: 22:00:00 5 mL via TOPICAL

## 2019-05-21 MED ORDER — CYCLOBENZAPRINE HCL 10 MG PO TABS
10.0000 mg | ORAL_TABLET | Freq: Three times a day (TID) | ORAL | Status: DC | PRN
  Administered 2019-05-22: 05:00:00 10 mg via ORAL
  Filled 2019-05-21: qty 1

## 2019-05-21 MED ORDER — ACETAMINOPHEN 325 MG PO TABS: 650.0000 mg | ORAL_TABLET | ORAL | Status: DC | PRN

## 2019-05-21 MED ORDER — SODIUM CHLORIDE 0.9% FLUSH
3.0000 mL | Freq: Two times a day (BID) | INTRAVENOUS | Status: DC
  Administered 2019-05-22 (×2): 3 mL via INTRAVENOUS

## 2019-05-21 MED ORDER — 0.9 % SODIUM CHLORIDE (POUR BTL) OPTIME
TOPICAL | Status: DC | PRN
  Administered 2019-05-21: 22:00:00 1000 mL

## 2019-05-21 MED ORDER — MENTHOL 3 MG MT LOZG: 1.0000 | LOZENGE | OROMUCOSAL | Status: DC | PRN

## 2019-05-21 MED ORDER — SUCCINYLCHOLINE CHLORIDE 20 MG/ML IJ SOLN
INTRAMUSCULAR | Status: DC | PRN
  Administered 2019-05-21: 120 mg via INTRAVENOUS

## 2019-05-21 MED ORDER — DIPHENHYDRAMINE HCL 50 MG/ML IJ SOLN
INTRAMUSCULAR | Status: DC | PRN
  Administered 2019-05-21: 12.5 mg via INTRAVENOUS

## 2019-05-21 MED ORDER — HYDROCODONE-ACETAMINOPHEN 5-325 MG PO TABS: 1.0000 | ORAL_TABLET | ORAL | Status: DC | PRN

## 2019-05-21 MED ORDER — ACETAMINOPHEN 10 MG/ML IV SOLN
INTRAVENOUS | Status: DC | PRN
  Administered 2019-05-21: 1000 mg via INTRAVENOUS

## 2019-05-21 MED ORDER — DIPHENHYDRAMINE HCL 50 MG/ML IJ SOLN
INTRAMUSCULAR | Status: AC
  Filled 2019-05-21: qty 1

## 2019-05-21 MED ORDER — ONDANSETRON HCL 4 MG/2ML IJ SOLN
INTRAMUSCULAR | Status: DC | PRN
  Administered 2019-05-21: 4 mg via INTRAVENOUS

## 2019-05-21 MED ORDER — PHENOL 1.4 % MT LIQD: 1.0000 | OROMUCOSAL | Status: DC | PRN

## 2019-05-21 MED ORDER — MORPHINE SULFATE (PF) 4 MG/ML IV SOLN
4.0000 mg | Freq: Once | INTRAVENOUS | Status: AC
  Administered 2019-05-21: 15:00:00 4 mg via INTRAVENOUS
  Filled 2019-05-21: qty 1

## 2019-05-21 MED ORDER — ONDANSETRON HCL 4 MG/2ML IJ SOLN
INTRAMUSCULAR | Status: AC
  Filled 2019-05-21: qty 2

## 2019-05-21 MED ORDER — LACTATED RINGERS IV SOLN
INTRAVENOUS | Status: DC | PRN
  Administered 2019-05-21 (×2): via INTRAVENOUS

## 2019-05-21 MED ORDER — DEXAMETHASONE SODIUM PHOSPHATE 10 MG/ML IJ SOLN
INTRAMUSCULAR | Status: DC | PRN
  Administered 2019-05-21: 10 mg via INTRAVENOUS

## 2019-05-21 MED ORDER — FENTANYL CITRATE (PF) 100 MCG/2ML IJ SOLN
INTRAMUSCULAR | Status: DC | PRN
  Administered 2019-05-21: 100 ug via INTRAVENOUS
  Administered 2019-05-21: 50 ug via INTRAVENOUS
  Administered 2019-05-21: 100 ug via INTRAVENOUS

## 2019-05-21 MED ORDER — ACETAMINOPHEN 650 MG RE SUPP: 650.0000 mg | RECTAL | Status: DC | PRN

## 2019-05-21 MED ORDER — THROMBIN 20000 UNITS EX SOLR
CUTANEOUS | Status: DC | PRN
  Administered 2019-05-21: 22:00:00 20 mL via TOPICAL

## 2019-05-21 MED ORDER — THROMBIN 5000 UNITS EX SOLR
CUTANEOUS | Status: AC
  Filled 2019-05-21: qty 5000

## 2019-05-21 MED ORDER — CEFAZOLIN SODIUM-DEXTROSE 2-4 GM/100ML-% IV SOLN
2.0000 g | INTRAVENOUS | Status: AC
  Administered 2019-05-21: 2 g via INTRAVENOUS
  Filled 2019-05-21: qty 100

## 2019-05-21 MED ORDER — SUCCINYLCHOLINE CHLORIDE 200 MG/10ML IV SOSY
PREFILLED_SYRINGE | INTRAVENOUS | Status: AC
  Filled 2019-05-21: qty 10

## 2019-05-21 MED ORDER — LIDOCAINE 2% (20 MG/ML) 5 ML SYRINGE
INTRAMUSCULAR | Status: DC | PRN
  Administered 2019-05-21: 100 mg via INTRAVENOUS

## 2019-05-21 MED ORDER — ONDANSETRON HCL 4 MG PO TABS: 4.0000 mg | ORAL_TABLET | Freq: Four times a day (QID) | ORAL | Status: DC | PRN

## 2019-05-21 MED ORDER — PROPOFOL 10 MG/ML IV BOLUS
INTRAVENOUS | Status: AC
  Filled 2019-05-21: qty 20

## 2019-05-21 MED ORDER — CEFAZOLIN SODIUM-DEXTROSE 1-4 GM/50ML-% IV SOLN
1.0000 g | Freq: Three times a day (TID) | INTRAVENOUS | Status: AC
  Administered 2019-05-22 (×2): 1 g via INTRAVENOUS
  Filled 2019-05-21 (×2): qty 50

## 2019-05-21 MED ORDER — HYDROCODONE-ACETAMINOPHEN 10-325 MG PO TABS
2.0000 | ORAL_TABLET | ORAL | Status: DC | PRN
  Administered 2019-05-22 (×2): 2 via ORAL
  Filled 2019-05-21 (×2): qty 2

## 2019-05-21 MED ORDER — PHENYLEPHRINE 40 MCG/ML (10ML) SYRINGE FOR IV PUSH (FOR BLOOD PRESSURE SUPPORT)
PREFILLED_SYRINGE | INTRAVENOUS | Status: AC
  Filled 2019-05-21: qty 10

## 2019-05-21 MED ORDER — HYDROMORPHONE HCL 1 MG/ML IJ SOLN
1.0000 mg | INTRAMUSCULAR | Status: DC | PRN
  Administered 2019-05-22: 05:00:00 1 mg via INTRAVENOUS
  Filled 2019-05-21: qty 1

## 2019-05-21 MED ORDER — THROMBIN 20000 UNITS EX SOLR
CUTANEOUS | Status: AC
  Filled 2019-05-21: qty 20000

## 2019-05-21 MED ORDER — SODIUM CHLORIDE 0.9 % IV SOLN
250.0000 mL | INTRAVENOUS | Status: DC
  Administered 2019-05-22: 250 mL via INTRAVENOUS

## 2019-05-21 MED ORDER — MIDAZOLAM HCL 2 MG/2ML IJ SOLN
INTRAMUSCULAR | Status: AC
  Filled 2019-05-21: qty 2

## 2019-05-21 MED ORDER — DEXAMETHASONE SODIUM PHOSPHATE 10 MG/ML IJ SOLN
INTRAMUSCULAR | Status: AC
  Filled 2019-05-21: qty 1

## 2019-05-21 MED ORDER — MIDAZOLAM HCL 5 MG/5ML IJ SOLN
INTRAMUSCULAR | Status: DC | PRN
  Administered 2019-05-21: 2 mg via INTRAVENOUS

## 2019-05-21 MED ORDER — SODIUM CHLORIDE 0.9% FLUSH: 3.0000 mL | INTRAVENOUS | Status: DC | PRN

## 2019-05-21 MED ORDER — PROPOFOL 10 MG/ML IV BOLUS
INTRAVENOUS | Status: DC | PRN
  Administered 2019-05-21: 150 mg via INTRAVENOUS

## 2019-05-21 MED ORDER — ONDANSETRON HCL 4 MG/2ML IJ SOLN: 4.0000 mg | Freq: Four times a day (QID) | INTRAMUSCULAR | Status: DC | PRN

## 2019-05-21 SURGICAL SUPPLY — 59 items
BAG DECANTER FOR FLEXI CONT (MISCELLANEOUS) ×3 IMPLANT
BENZOIN TINCTURE PRP APPL 2/3 (GAUZE/BANDAGES/DRESSINGS) ×3 IMPLANT
BIT DRILL 13 (BIT) ×2 IMPLANT
BIT DRILL 13MM (BIT) ×1
BUR MATCHSTICK NEURO 3.0 LAGG (BURR) ×3 IMPLANT
CAGE PEEK 7X14X11 (Cage) ×2 IMPLANT
CANISTER SUCT 3000ML PPV (MISCELLANEOUS) ×3 IMPLANT
CARTRIDGE OIL MAESTRO DRILL (MISCELLANEOUS) ×1 IMPLANT
CLOSURE STERI-STRIP 1/2X4 (GAUZE/BANDAGES/DRESSINGS) ×1
CLOSURE WOUND 1/2 X4 (GAUZE/BANDAGES/DRESSINGS) ×1
CLSR STERI-STRIP ANTIMIC 1/2X4 (GAUZE/BANDAGES/DRESSINGS) ×2 IMPLANT
COVER WAND RF STERILE (DRAPES) ×3 IMPLANT
DIFFUSER DRILL AIR PNEUMATIC (MISCELLANEOUS) ×3 IMPLANT
DRAPE C-ARM 42X72 X-RAY (DRAPES) ×6 IMPLANT
DRAPE LAPAROTOMY 100X72 PEDS (DRAPES) ×3 IMPLANT
DRAPE MICROSCOPE LEICA (MISCELLANEOUS) ×3 IMPLANT
DRSG OPSITE POSTOP 4X6 (GAUZE/BANDAGES/DRESSINGS) ×3 IMPLANT
DURAPREP 6ML APPLICATOR 50/CS (WOUND CARE) ×3 IMPLANT
ELECT COATED BLADE 2.86 ST (ELECTRODE) ×3 IMPLANT
ELECT REM PT RETURN 9FT ADLT (ELECTROSURGICAL) ×3
ELECTRODE REM PT RTRN 9FT ADLT (ELECTROSURGICAL) ×1 IMPLANT
GAUZE 4X4 16PLY RFD (DISPOSABLE) IMPLANT
GAUZE SPONGE 4X4 12PLY STRL (GAUZE/BANDAGES/DRESSINGS) ×3 IMPLANT
GLOVE BIO SURGEON STRL SZ 6.5 (GLOVE) ×2 IMPLANT
GLOVE BIO SURGEON STRL SZ7 (GLOVE) ×6 IMPLANT
GLOVE BIO SURGEONS STRL SZ 6.5 (GLOVE) ×1
GLOVE BIOGEL PI IND STRL 7.5 (GLOVE) ×1 IMPLANT
GLOVE BIOGEL PI INDICATOR 7.5 (GLOVE) ×2
GLOVE ECLIPSE 9.0 STRL (GLOVE) ×3 IMPLANT
GLOVE EXAM NITRILE XL STR (GLOVE) IMPLANT
GOWN STRL REUS W/ TWL LRG LVL3 (GOWN DISPOSABLE) IMPLANT
GOWN STRL REUS W/ TWL XL LVL3 (GOWN DISPOSABLE) IMPLANT
GOWN STRL REUS W/TWL 2XL LVL3 (GOWN DISPOSABLE) IMPLANT
GOWN STRL REUS W/TWL LRG LVL3 (GOWN DISPOSABLE)
GOWN STRL REUS W/TWL XL LVL3 (GOWN DISPOSABLE)
HALTER HD/CHIN CERV TRACTION D (MISCELLANEOUS) ×3 IMPLANT
HEMOSTAT POWDER KIT SURGIFOAM (HEMOSTASIS) IMPLANT
KIT BASIN OR (CUSTOM PROCEDURE TRAY) ×3 IMPLANT
KIT TURNOVER KIT B (KITS) ×3 IMPLANT
NEEDLE SPNL 20GX3.5 QUINCKE YW (NEEDLE) ×3 IMPLANT
NS IRRIG 1000ML POUR BTL (IV SOLUTION) ×3 IMPLANT
OIL CARTRIDGE MAESTRO DRILL (MISCELLANEOUS) ×3
PACK LAMINECTOMY NEURO (CUSTOM PROCEDURE TRAY) ×3 IMPLANT
PAD ARMBOARD 7.5X6 YLW CONV (MISCELLANEOUS) ×9 IMPLANT
PLATE ELITE VISION 25MM (Plate) ×3 IMPLANT
RUBBERBAND STERILE (MISCELLANEOUS) ×6 IMPLANT
SCREW ST FIX 4 ATL 3120213 (Screw) ×12 IMPLANT
SPACER SPNL 11X14X7XPEEK CVD (Cage) ×1 IMPLANT
SPCR SPNL 11X14X7XPEEK CVD (Cage) ×1 IMPLANT
SPONGE INTESTINAL PEANUT (DISPOSABLE) ×3 IMPLANT
SPONGE SURGIFOAM ABS GEL SZ50 (HEMOSTASIS) ×3 IMPLANT
STRIP CLOSURE SKIN 1/2X4 (GAUZE/BANDAGES/DRESSINGS) ×2 IMPLANT
SUT VIC AB 3-0 SH 8-18 (SUTURE) ×3 IMPLANT
SUT VIC AB 4-0 RB1 18 (SUTURE) ×3 IMPLANT
TAPE CLOTH 4X10 WHT NS (GAUZE/BANDAGES/DRESSINGS) ×3 IMPLANT
TOWEL GREEN STERILE (TOWEL DISPOSABLE) ×3 IMPLANT
TOWEL GREEN STERILE FF (TOWEL DISPOSABLE) ×3 IMPLANT
TRAP SPECIMEN MUCOUS 40CC (MISCELLANEOUS) ×3 IMPLANT
WATER STERILE IRR 1000ML POUR (IV SOLUTION) ×3 IMPLANT
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
## 2019-05-21 NOTE — ED Notes (Addendum)
Back from MRI. Waiting for COVID result to go to OR

## 2019-05-21 NOTE — Anesthesia Preprocedure Evaluation (Addendum)
Anesthesia Evaluation  Patient identified by MRN, date of birth, ID band Patient awake    Reviewed: Allergy & Precautions, NPO status , Patient's Chart, lab work & pertinent test results  History of Anesthesia Complications Negative for: history of anesthetic complications  Airway Mallampati: II  TM Distance: >3 FB Neck ROM: Full    Dental no notable dental hx. (+) Dental Advisory Given   Pulmonary Current Smoker,    Pulmonary exam normal        Cardiovascular negative cardio ROS Normal cardiovascular exam     Neuro/Psych  C-spine not cleared    GI/Hepatic negative GI ROS, Neg liver ROS,   Endo/Other  negative endocrine ROS  Renal/GU negative Renal ROS     Musculoskeletal negative musculoskeletal ROS (+)   Abdominal   Peds  Hematology negative hematology ROS (+)   Anesthesia Other Findings Day of surgery medications reviewed with the patient.  Reproductive/Obstetrics                            Anesthesia Physical Anesthesia Plan  ASA: III and emergent  Anesthesia Plan: General   Post-op Pain Management:    Induction: Intravenous  PONV Risk Score and Plan: 2 and Ondansetron, Dexamethasone and Diphenhydramine  Airway Management Planned: Oral ETT and Video Laryngoscope Planned  Additional Equipment:   Intra-op Plan:   Post-operative Plan: Extubation in OR  Informed Consent: I have reviewed the patients History and Physical, chart, labs and discussed the procedure including the risks, benefits and alternatives for the proposed anesthesia with the patient or authorized representative who has indicated his/her understanding and acceptance.     Dental advisory given  Plan Discussed with: CRNA, Anesthesiologist and Surgeon  Anesthesia Plan Comments: (Discussed C-Spine fracture with Dr. Dutch Quint who requested neutral positioning, collar in place for asleep intubation.)       Anesthesia Quick Evaluation

## 2019-05-21 NOTE — Discharge Instructions (Addendum)
Discharge Instructions  Continue wearing your cervical collar whenever out of bed. You can take it off for showering/bathing as needed. Do not lift more than 20 pounds until discussed at your follow up appointment with Dr. Jordan Likes. No sports or other activities that would put a significant force on your neck.   Your incision is closed with dermabond (purple glue). This will naturally fall off over the next 1-2 weeks.   Okay to shower on the day of discharge. Use regular soap and water and try to be gentle when cleaning your incision.   Follow up with Dr. Jordan Likes in 2 weeks after discharge. If you do not already have a discharge appointment, please call his office at [phone] to schedule a follow up appointment. If you have any concerns or questions, please call the office and let us know.

## 2019-05-21 NOTE — ED Notes (Signed)
ccollar changed to [place] with Dr. Alyce Pagan holding cspine

## 2019-05-21 NOTE — Transfer of Care (Signed)
Immediate Anesthesia Transfer of Care Note  Patient: Stephen Gutierrez  Procedure(s) Performed: Cervical five - six anterior cervical discectomy with interbody fusion, open reduction of spinal fracture dislocation (N/A Neck)  Patient Location: PACU  Anesthesia Type:General  Level of Consciousness: awake and alert   Airway & Oxygen Therapy: Patient Spontanous Breathing and Patient connected to nasal cannula oxygen  Post-op Assessment: Report given to RN, Post -op Vital signs reviewed and stable and Patient moving all extremities X 4  Post vital signs: Reviewed and stable  Last Vitals:  Vitals Value Taken Time  BP 114/58 05/21/2019 11:02 PM  Temp    Pulse 83 05/21/2019 11:09 PM  Resp 29 05/21/2019 11:09 PM  SpO2 100 % 05/21/2019 11:09 PM  Vitals shown include unvalidated device data.  Last Pain:  Vitals:   05/21/19 2300  TempSrc:   PainSc: (P) Asleep         Complications: No apparent anesthesia complications
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
## 2019-05-21 NOTE — Anesthesia Procedure Notes (Signed)
Procedure Name: Intubation Date/Time: 05/21/2019 9:21 PM Performed by: Epifanio Lesches, CRNA Pre-anesthesia Checklist: Patient identified, Emergency Drugs available, Suction available and Patient being monitored Patient Re-evaluated:Patient Re-evaluated prior to induction Oxygen Delivery Method: Circle System Utilized Preoxygenation: Pre-oxygenation with 100% oxygen Induction Type: IV induction and Rapid sequence Laryngoscope Size: Glidescope and 4 Tube type: Oral Tube size: 7.5 mm Number of attempts: 1 Airway Equipment and Method: Stylet and Oral airway Placement Confirmation: ETT inserted through vocal cords under direct vision,  positive ETCO2 and breath sounds checked- equal and bilateral Secured at: 21 cm Tube secured with: Tape Dental Injury: Teeth and Oropharynx as per pre-operative assessment  Difficulty Due To: Difficult Airway-  due to neck instability Comments: Maintained head and neck midline and neutral position through out glidescope intubation

## 2019-05-21 NOTE — ED Notes (Signed)
Attempted call to OR.  OR not ready. Paged Neurosurgeon with return call to review MRI and COVID negative.

## 2019-05-21 NOTE — Brief Op Note (Signed)
05/21/2019  10:44 PM  PATIENT:  Stephen Gutierrez  28 y.o. 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  POST-OPERATIVE DIAGNOSIS:  * No post-op diagnosis entered *  PROCEDURE:  Procedure(s): Cervical five - six anterior cervical discectomy with interbody fusion, open reduction of spinal fracture dislocation (N/A)  SURGEON:  Surgeon(s) and Role:    Julio Sicks, MD - Primary  PHYSICIAN ASSISTANT:   ASSISTANTSDoran Durand, NP   ANESTHESIA:   general  EBL:  10 mL   BLOOD ADMINISTERED:none  DRAINS: none   LOCAL MEDICATIONS USED:  NONE  SPECIMEN:  No Specimen  DISPOSITION OF SPECIMEN:  N/A  COUNTS:  YES  TOURNIQUET:  * No tourniquets in log *  DICTATION: .Dragon Dictation  PLAN OF CARE: Admit to inpatient   PATIENT DISPOSITION:  PACU - hemodynamically stable.   Delay start of Pharmacological VTE agent (>24hrs) due to surgical blood loss or risk of bleeding: yes
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
## 2019-05-21 NOTE — ED Notes (Signed)
Patient transported to X-ray 
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
## 2019-05-22 ENCOUNTER — Other Ambulatory Visit: Payer: Self-pay

## 2019-05-22 LAB — RAPID URINE DRUG SCREEN, HOSP PERFORMED
Amphetamines: POSITIVE — AB
Barbiturates: NOT DETECTED
Benzodiazepines: POSITIVE — AB
Cocaine: NOT DETECTED
Opiates: POSITIVE — AB
Tetrahydrocannabinol: POSITIVE — AB

## 2019-05-22 MED ORDER — CYCLOBENZAPRINE HCL 10 MG PO TABS: 10.0000 mg | ORAL_TABLET | Freq: Three times a day (TID) | ORAL | 0 refills | Status: DC | PRN

## 2019-05-22 MED ORDER — HYDROCODONE-ACETAMINOPHEN 5-325 MG PO TABS: 1.0000 | ORAL_TABLET | ORAL | 0 refills | Status: DC | PRN
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
## 2019-05-22 NOTE — Anesthesia Postprocedure Evaluation (Signed)
Anesthesia Post Note  Patient: Stephen Gutierrez  Procedure(s) Performed: Cervical five - six anterior cervical discectomy with interbody fusion, open reduction of spinal fracture dislocation (N/A Neck)     Patient location during evaluation: PACU Anesthesia Type: General Level of consciousness: sedated Pain management: pain level controlled Vital Signs Assessment: post-procedure vital signs reviewed and stable Respiratory status: spontaneous breathing and respiratory function stable Cardiovascular status: stable Postop Assessment: no apparent nausea or vomiting Anesthetic complications: no    Last Vitals:  Vitals:   05/21/19 2330 05/21/19 2354  BP: 121/74 (!) 144/97  Pulse: 71   Resp: (!) 23 (!) 22  Temp: (!) 36.3 C 37.2 C  SpO2: 100%     Last Pain:  Vitals:   05/21/19 2354  TempSrc: Oral  PainSc: 6                  Ramin Zoll DANIEL
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
## 2019-05-22 NOTE — Progress Notes (Signed)
Neurosurgery Service Progress Note  Subjective: No acute events overnight, no dysphagia with fluids, having some parasthesias in the left arm, but no weakness   Objective: Vitals:   05/21/19 2330 05/21/19 2354 05/22/19 0429 05/22/19 0828  BP: 121/74 (!) 144/97  137/80  Pulse: 71   (!) 105  Resp: (!) 23 (!) 22  20  Temp: (!) 97.3 F (36.3 C) 99 F (37.2 C) 98.7 F (37.1 C) 98.3 F (36.8 C)  TempSrc:  Oral Oral Oral  SpO2: 100%   98%  Weight: 59.4 kg     Height: 5\' 10"  (1.778 m)      Temp (24hrs), Avg:98.1 F (36.7 C), Min:97.3 F (36.3 C), Max:99 F (37.2 C)  CBC Latest Ref Rng & Units 05/21/2019 12/03/2017 03/03/2017  WBC 4.0 - 10.5 K/uL 18.9(H) 8.8 14.1(H)  Hemoglobin 13.0 - 17.0 g/dL 25.314.4 66.413.0 40.313.1  Hematocrit 39.0 - 52.0 % 43.7 40.0 40.6  Platelets 150 - 400 K/uL 223 298 283   BMP Latest Ref Rng & Units 05/21/2019 12/03/2017 03/03/2017  Glucose 70 - 99 mg/dL 93 474(Q103(H) 83  BUN 6 - 20 mg/dL 6 10 10   Creatinine 0.61 - 1.24 mg/dL 5.951.05 6.380.89 7.560.98  Sodium 135 - 145 mmol/L 137 139 137  Potassium 3.5 - 5.1 mmol/L 4.1 3.8 3.7  Chloride 98 - 111 mmol/L 102 107 104  CO2 22 - 32 mmol/L 25 27 23   Calcium 8.9 - 10.3 mg/dL 9.7 8.9 9.5    Intake/Output Summary (Last 24 hours) at 05/22/2019 0951 Last data filed at 05/22/2019 0600 Gross per 24 hour  Intake 1153 ml  Output 810 ml  Net 343 ml    Current Facility-Administered Medications:  .  0.9 %  sodium chloride infusion, 250 mL, Intravenous, Continuous, Pool, Sherilyn CooterHenry, MD, Last Rate: 1 mL/hr at 05/22/19 0826 .  acetaminophen (TYLENOL) tablet 650 mg, 650 mg, Oral, Q4H PRN **OR** acetaminophen (TYLENOL) suppository 650 mg, 650 mg, Rectal, Q4H PRN, Pool, Sherilyn CooterHenry, MD .  ceFAZolin (ANCEF) IVPB 1 g/50 mL premix, 1 g, Intravenous, Q8H, Pool, Sherilyn CooterHenry, MD, Last Rate: 100 mL/hr at 05/22/19 0451, 1 g at 05/22/19 0451 .  cyclobenzaprine (FLEXERIL) tablet 10 mg, 10 mg, Oral, TID PRN, Julio SicksPool, Henry, MD, 10 mg at 05/22/19 0456 .  HYDROcodone-acetaminophen  (NORCO) 10-325 MG per tablet 2 tablet, 2 tablet, Oral, Q4H PRN, Julio SicksPool, Henry, MD, 2 tablet at 05/22/19 0452 .  HYDROcodone-acetaminophen (NORCO/VICODIN) 5-325 MG per tablet 1 tablet, 1 tablet, Oral, Q4H PRN, Julio SicksPool, Henry, MD .  HYDROmorphone (DILAUDID) injection 1 mg, 1 mg, Intravenous, Q2H PRN, Julio SicksPool, Henry, MD, 1 mg at 05/22/19 0455 .  menthol-cetylpyridinium (CEPACOL) lozenge 3 mg, 1 lozenge, Oral, PRN **OR** phenol (CHLORASEPTIC) mouth spray 1 spray, 1 spray, Mouth/Throat, PRN, Pool, Henry, MD .  ondansetron (ZOFRAN) tablet 4 mg, 4 mg, Oral, Q6H PRN **OR** ondansetron (ZOFRAN) injection 4 mg, 4 mg, Intravenous, Q6H PRN, Pool, Sherilyn CooterHenry, MD .  sodium chloride flush (NS) 0.9 % injection 3 mL, 3 mL, Intravenous, Q12H, Julio SicksPool, Henry, MD, 3 mL at 05/22/19 0948 .  sodium chloride flush (NS) 0.9 % injection 3 mL, 3 mL, Intravenous, PRN, Julio SicksPool, Henry, MD   Physical Exam: Strength 5/5 x4, SILTx4 except for some distal left C7/C8 numbness, no hoffman's, neck soft  Assessment & Plan: 28 y.o. man s/p fall with C5-6 fracture dislocation and rotatory subluxation, 5/23 s/p operative reduction and C5-6 ACDF, recovering well. Other known injuries include likely ankle sprain w/ neg XR.  -activity as tolerated,  continue wearing C collar -PT recommending CIR, discussed with patient, who would rather go home. He is voiding without issue, ambulating with a walker, has yet to eat solid food. He has multiple healthcare workers in his family and feels safe going home. I advised that I would recommend him remaining in-house and going to CIR and explained why. But, ultimately, the decision is his. -if pt tolerates solid food without difficulty, can be discharged this afternoon -discussed importance of follow up post-op, pt concerned about medical bills and I explained that his post-op care is included in his global and importance of f/u  Stephen Gutierrez  05/22/19 9:51 AM

## 2019-05-24 ENCOUNTER — Encounter (HOSPITAL_COMMUNITY): Payer: Self-pay | Admitting: Neurosurgery

## 2019-06-03 ENCOUNTER — Telehealth: Payer: Self-pay

## 2021-02-03 ENCOUNTER — Emergency Department (HOSPITAL_COMMUNITY)
Admission: EM | Admit: 2021-02-03 | Discharge: 2021-02-03 | Disposition: A | Discharge status: Home / Self Care | Payer: Self-pay | Attending: Emergency Medicine | Admitting: Emergency Medicine

## 2021-02-03 ENCOUNTER — Other Ambulatory Visit: Payer: Self-pay

## 2021-02-03 ENCOUNTER — Emergency Department (HOSPITAL_COMMUNITY): Payer: Self-pay

## 2021-02-03 ENCOUNTER — Encounter (HOSPITAL_COMMUNITY): Payer: Self-pay

## 2021-02-03 DIAGNOSIS — S40211A Abrasion of right shoulder, initial encounter: Secondary | ICD-10-CM | POA: Insufficient documentation

## 2021-02-03 DIAGNOSIS — S20319A Abrasion of unspecified front wall of thorax, initial encounter: Secondary | ICD-10-CM | POA: Insufficient documentation

## 2021-02-03 DIAGNOSIS — F10920 Alcohol use, unspecified with intoxication, uncomplicated: Secondary | ICD-10-CM

## 2021-02-03 DIAGNOSIS — T07XXXA Unspecified multiple injuries, initial encounter: Secondary | ICD-10-CM

## 2021-02-03 DIAGNOSIS — F10129 Alcohol abuse with intoxication, unspecified: Secondary | ICD-10-CM | POA: Insufficient documentation

## 2021-02-03 DIAGNOSIS — T84296A Other mechanical complication of internal fixation device of vertebrae, initial encounter: Secondary | ICD-10-CM | POA: Insufficient documentation

## 2021-02-03 DIAGNOSIS — S0093XA Contusion of unspecified part of head, initial encounter: Secondary | ICD-10-CM | POA: Insufficient documentation

## 2021-02-03 DIAGNOSIS — T84498A Other mechanical complication of other internal orthopedic devices, implants and grafts, initial encounter: Secondary | ICD-10-CM

## 2021-02-03 DIAGNOSIS — S40212A Abrasion of left shoulder, initial encounter: Secondary | ICD-10-CM | POA: Insufficient documentation

## 2021-02-03 LAB — RAPID URINE DRUG SCREEN, HOSP PERFORMED
Amphetamines: POSITIVE — AB
Barbiturates: NOT DETECTED
Benzodiazepines: NOT DETECTED
Cocaine: NOT DETECTED
Opiates: NOT DETECTED
Tetrahydrocannabinol: POSITIVE — AB

## 2021-02-03 LAB — COMPREHENSIVE METABOLIC PANEL
ALT: 38 U/L (ref 0–44)
AST: 79 U/L — ABNORMAL HIGH (ref 15–41)
Albumin: 4.6 g/dL (ref 3.5–5.0)
Alkaline Phosphatase: 55 U/L (ref 38–126)
Anion gap: 15 (ref 5–15)
BUN: 10 mg/dL (ref 6–20)
CO2: 22 mmol/L (ref 22–32)
Calcium: 8.8 mg/dL — ABNORMAL LOW (ref 8.9–10.3)
Chloride: 110 mmol/L (ref 98–111)
Creatinine, Ser: 1.29 mg/dL — ABNORMAL HIGH (ref 0.61–1.24)
GFR, Estimated: >60 mL/min (ref >60)
Glucose, Bld: 91 mg/dL (ref 70–99)
Potassium: 3.6 mmol/L (ref 3.5–5.1)
Sodium: 147 mmol/L — ABNORMAL HIGH (ref 135–145)
Total Bilirubin: 0.7 mg/dL (ref 0.3–1.2)
Total Protein: 7.4 g/dL (ref 6.5–8.1)

## 2021-02-03 LAB — CBC WITH DIFFERENTIAL/PLATELET
Abs Immature Granulocytes: 0.03 10*3/uL (ref 0.00–0.07)
Basophils Absolute: 0 10*3/uL (ref 0.0–0.1)
Basophils Relative: 0 %
Eosinophils Absolute: 0 10*3/uL (ref 0.0–0.5)
Eosinophils Relative: 0 %
HCT: 42.4 % (ref 39.0–52.0)
Hemoglobin: 14.4 g/dL (ref 13.0–17.0)
Immature Granulocytes: 0 %
Lymphocytes Relative: 14 %
Lymphs Abs: 1.2 10*3/uL (ref 0.7–4.0)
MCH: 33.2 pg (ref 26.0–34.0)
MCHC: 34 g/dL (ref 30.0–36.0)
MCV: 97.7 fL (ref 80.0–100.0)
Monocytes Absolute: 0.5 10*3/uL (ref 0.1–1.0)
Monocytes Relative: 6 %
Neutro Abs: 6.9 10*3/uL (ref 1.7–7.7)
Neutrophils Relative %: 80 %
Platelets: 220 10*3/uL (ref 150–400)
RBC: 4.34 MIL/uL (ref 4.22–5.81)
RDW: 12.5 % (ref 11.5–15.5)
WBC: 8.6 10*3/uL (ref 4.0–10.5)
nRBC: 0 % (ref 0.0–0.2)

## 2021-02-03 LAB — ETHANOL: Alcohol, Ethyl (B): 263 mg/dL — ABNORMAL HIGH (ref <10)

## 2021-02-03 MED ORDER — SODIUM CHLORIDE 0.9 % IV BOLUS
1000.0000 mL | Freq: Once | INTRAVENOUS | Status: AC
  Administered 2021-02-03: 1000 mL via INTRAVENOUS
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
## 2021-02-03 NOTE — Discharge Instructions (Addendum)
You have loosening of your cervical hardware screws.  Since you are not currently having any pain, you do not need to wear a cervical collar however if you do began having pain in your neck, please wear the cervical collar provided.  You may follow-up with Dr. Jordan Likes from neurosurgery.
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
## 2021-06-27 ENCOUNTER — Other Ambulatory Visit: Payer: Self-pay

## 2021-06-27 ENCOUNTER — Encounter (HOSPITAL_COMMUNITY): Payer: Self-pay

## 2021-06-27 ENCOUNTER — Ambulatory Visit (HOSPITAL_COMMUNITY)
Admission: EM | Admit: 2021-06-27 | Discharge: 2021-06-27 | Disposition: A | Discharge status: Home / Self Care | Payer: Self-pay | Attending: Internal Medicine | Admitting: Internal Medicine

## 2021-06-27 DIAGNOSIS — Z113 Encounter for screening for infections with a predominantly sexual mode of transmission: Secondary | ICD-10-CM

## 2021-06-27 DIAGNOSIS — Z202 Contact with and (suspected) exposure to infections with a predominantly sexual mode of transmission: Secondary | ICD-10-CM

## 2021-06-27 MED ORDER — METRONIDAZOLE 500 MG PO TABS: 2000.0000 mg | ORAL_TABLET | Freq: Once | ORAL | 0 refills | Status: AC

## 2021-06-27 NOTE — Discharge Instructions (Addendum)
Go ahead and take medication as prescribed.  Should you need any further treatment, the clinic will call you to let you know.
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
## 2021-07-02 LAB — CYTOLOGY, (ORAL, ANAL, URETHRAL) ANCILLARY ONLY
Comment: NEGATIVE
Trichomonas: NEGATIVE
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
## 2021-11-23 IMAGING — CT CT CERVICAL SPINE W/O CM
4 series · 15 of 35 positions shown, 18 images · non-contrast
Comparison: Cervical spine CT 05/21/2019, postoperative cervical
spine radiographs 07/27/2019.

CLINICAL DATA: 29-year-old male status post head trauma.

EXAM:
CT CERVICAL SPINE WITHOUT CONTRAST
TECHNIQUE: Multidetector CT imaging of the cervical spine was performed without
intravenous contrast. Multiplanar CT image reconstructions were also
generated.

[Series 5: c spine soft · axial · 0.40mm/px · z∈[-266,-234]mm · 2 of 95 slices shown]
[im 16/95  soft-tissue]
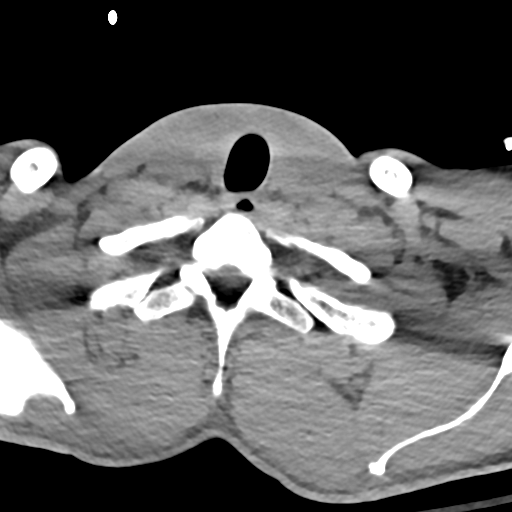
[im 32/95  soft-tissue]
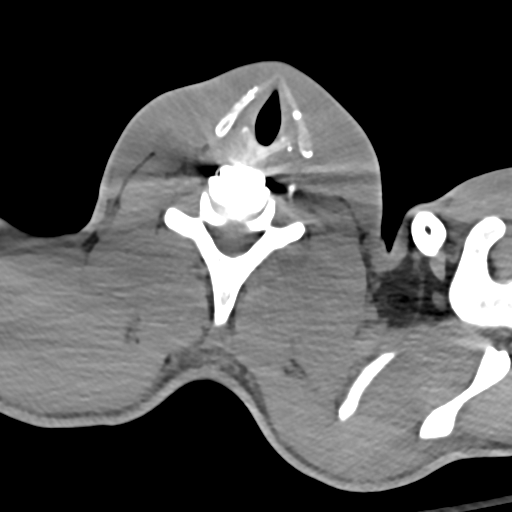

[Series 8: orthogonal axials · axial · 0.28mm/px · z∈[-288,-158]mm · 5 of 99 slices shown, 7 images]
[im 17/99  soft-tissue]
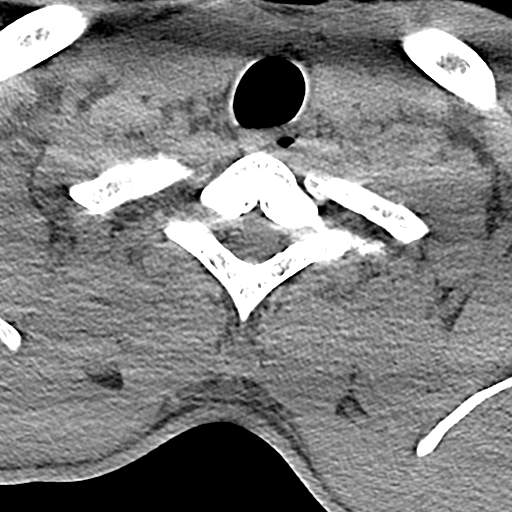
[im 17/99  bone]
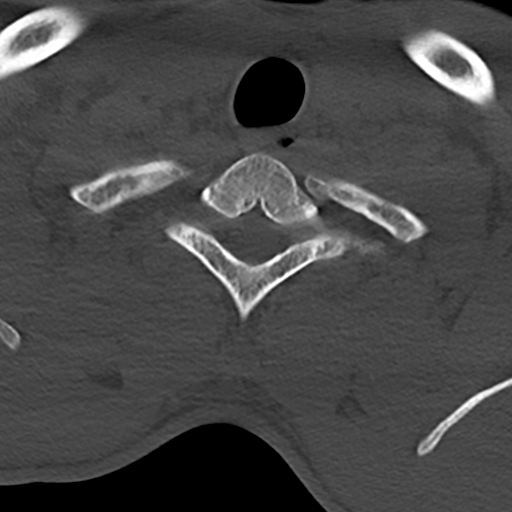
[im 33/99  bone]
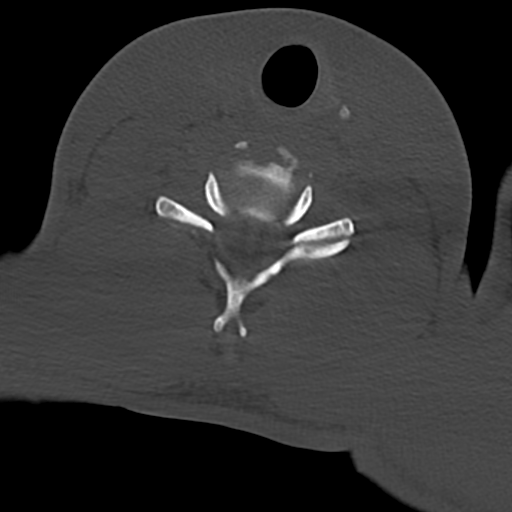
[im 50/99  bone]
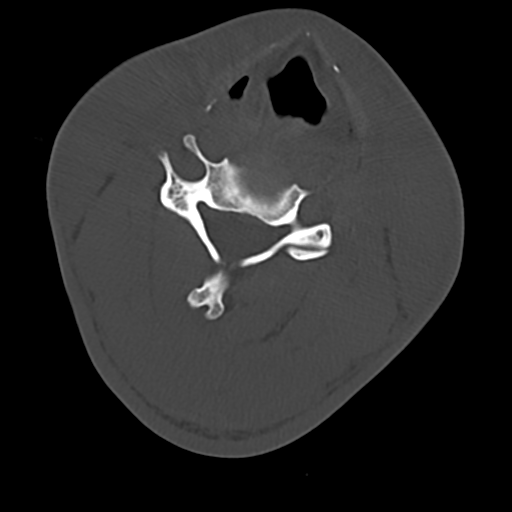
[im 66/99  bone]
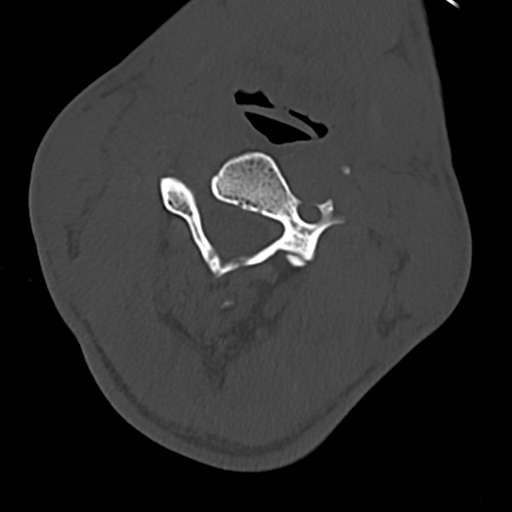
[im 82/99  soft-tissue]
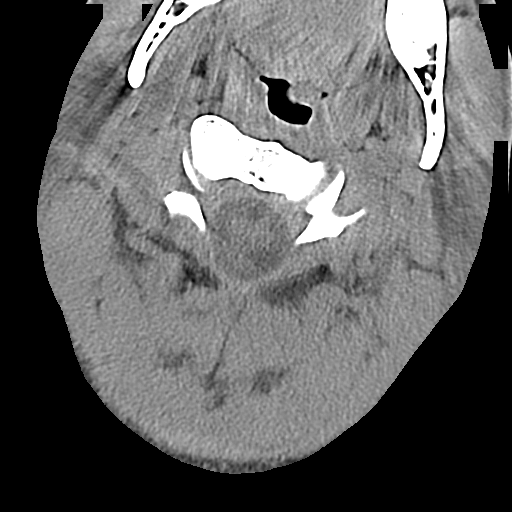
[im 82/99  bone]
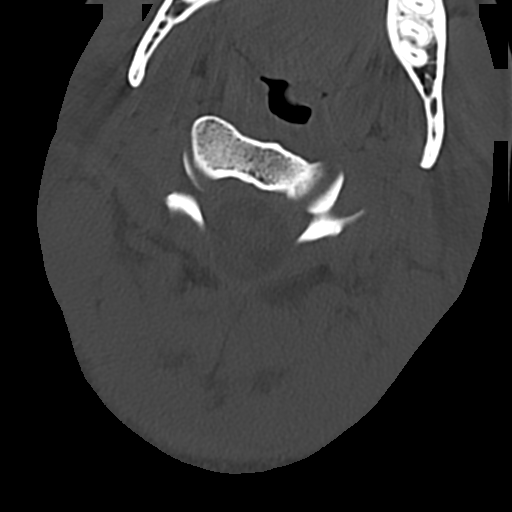

[Series 9: sag bone · sagittal · 0.40mm/px · 5 of 85 slices shown, 6 images]
[im 29/85  bone]
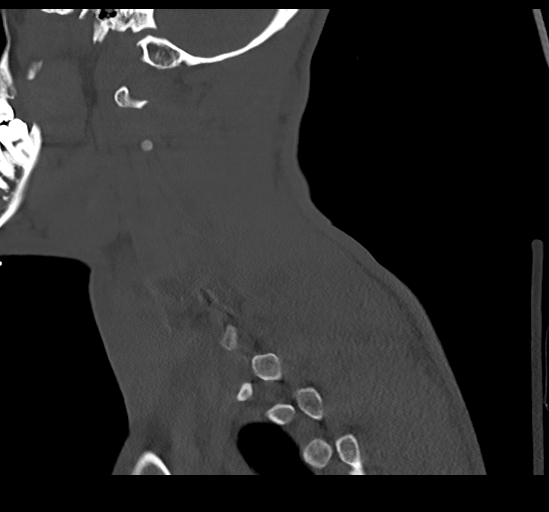
[im 36/85  bone]
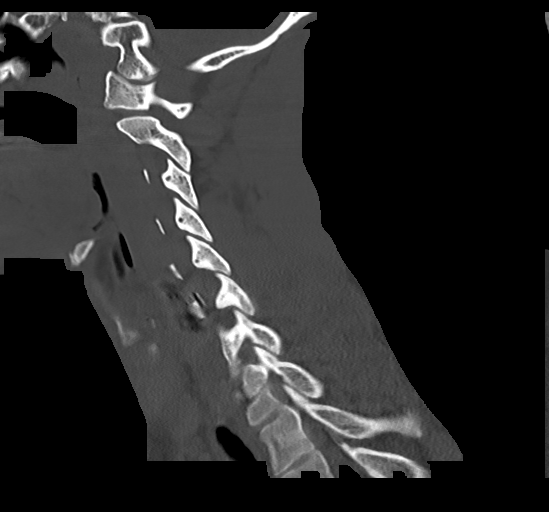
[im 43/85  soft-tissue]
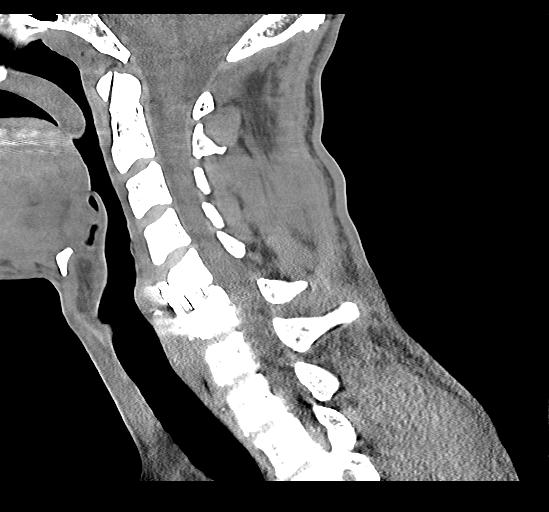
[im 43/85  bone]
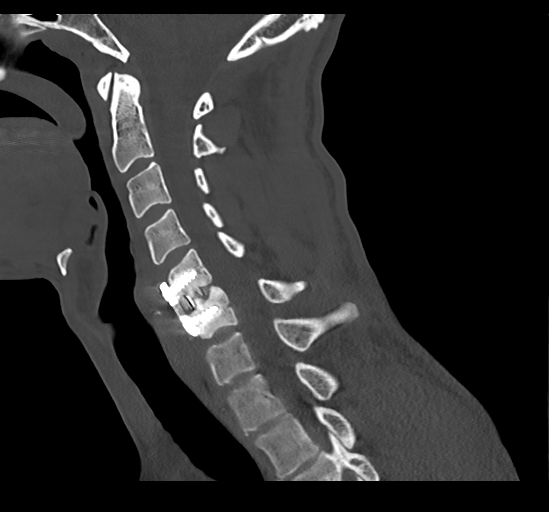
[im 50/85  bone]
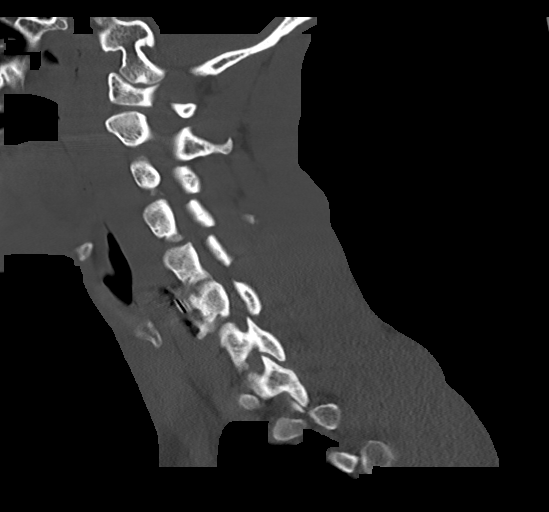
[im 57/85  bone]
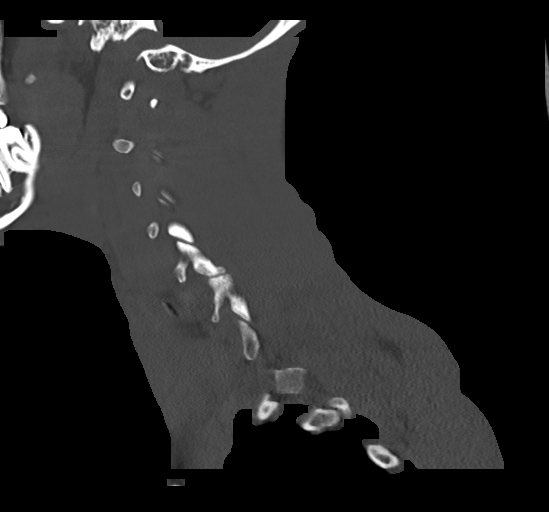

[Series 10: cor bone · coronal · 0.33mm/px · 3 of 110 slices shown]
[im 22/110  bone]
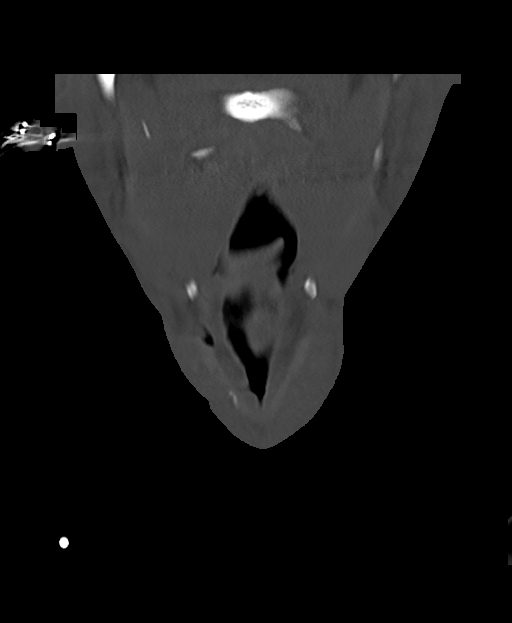
[im 44/110  bone]
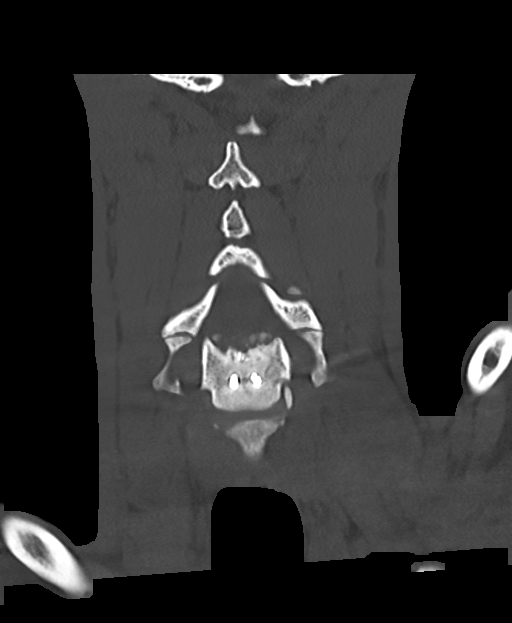
[im 66/110  bone]
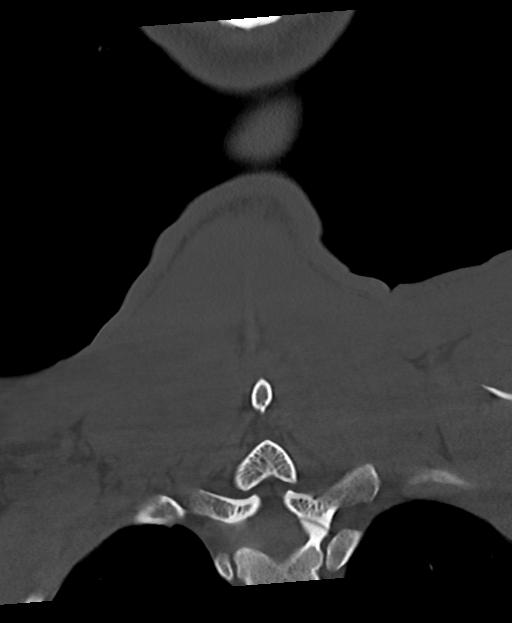

[15 of 35 positions shown; findings below may reference images not displayed]

FINDINGS: Alignment: Normalized alignment in the lower cervical spine compared
to the 8585 preoperative CT, aside from mild reversal of lordosis at
C5-C6. Resolved C5-C6 abnormal facet alignment on both sides.
Postoperative changes detailed below.

Skull base and vertebrae: Visualized skull base is intact. No
atlanto-occipital dissociation. Bone mineralization is within normal
limits. No acute osseous abnormality identified.

Soft tissues and spinal canal: No prevertebral fluid or swelling. No
visible canal hematoma. Negative noncontrast visible neck soft
tissues.

Disc levels: C5-C6 ACDF. Lucency along both C6 cortical screws
(series 8, image 63). There is interbody calcification, although no
convincing bridging bone. Mild posttraumatic/postoperative changes
to the right facets at this level. No cervical spinal stenosis
suspected.

Upper chest: Negative.

Other: None.
IMPRESSION: 1.  No acute traumatic injury identified in the cervical spine.
2. Operative fixation of the April 2019 C5-C6 injury. ACDF there but
with suspicion of hardware loosening at C6, and no convincing
arthrodesis.

## 2022-01-03 ENCOUNTER — Ambulatory Visit (HOSPITAL_COMMUNITY)
Admission: RE | Admit: 2022-01-03 | Discharge: 2022-01-03 | Disposition: A | Discharge status: Home / Self Care | Payer: Self-pay | Source: Ambulatory Visit | Attending: Family Medicine | Admitting: Family Medicine

## 2022-01-03 ENCOUNTER — Encounter (HOSPITAL_COMMUNITY): Payer: Self-pay

## 2022-01-03 ENCOUNTER — Other Ambulatory Visit: Payer: Self-pay

## 2022-01-03 VITALS — BP 124/86 | HR 86 | Temp 99.0°F | Resp 16

## 2022-01-03 DIAGNOSIS — J069 Acute upper respiratory infection, unspecified: Secondary | ICD-10-CM

## 2022-01-03 DIAGNOSIS — R197 Diarrhea, unspecified: Secondary | ICD-10-CM

## 2022-01-03 DIAGNOSIS — R059 Cough, unspecified: Secondary | ICD-10-CM | POA: Insufficient documentation

## 2022-01-03 DIAGNOSIS — J029 Acute pharyngitis, unspecified: Secondary | ICD-10-CM | POA: Insufficient documentation

## 2022-01-03 DIAGNOSIS — R52 Pain, unspecified: Secondary | ICD-10-CM | POA: Insufficient documentation

## 2022-01-03 DIAGNOSIS — F17210 Nicotine dependence, cigarettes, uncomplicated: Secondary | ICD-10-CM | POA: Insufficient documentation

## 2022-01-03 DIAGNOSIS — Z20822 Contact with and (suspected) exposure to covid-19: Secondary | ICD-10-CM | POA: Insufficient documentation

## 2022-01-03 LAB — POC INFLUENZA A AND B ANTIGEN (URGENT CARE ONLY)
INFLUENZA A ANTIGEN, POC: NEGATIVE
INFLUENZA B ANTIGEN, POC: NEGATIVE

## 2022-01-03 NOTE — ED Provider Notes (Signed)
[facility]    CSN: [id number] Arrival date & time: 01/03/22  1417      History   Chief Complaint Chief Complaint  Patient presents with   Cough   Sore Throat   Nasal Congestion    HPI Stephen Gutierrez is a 31 y.o. male.   Having flu like symptoms.  Headache x 3 days, a bit of diarrhea;  body aches;  no runny nose, congestion in his throat;  no cough;  no known fever;  using theraflu the last several days;   Not sure about sick contacts.  He has had covid vaccine, not the flu shot yet.   History reviewed. No pertinent past medical history.  Patient Active Problem List   Diagnosis Date Noted   Fatigue fracture of vertebra, cervical region, init for fx 05/21/2019   Cervical spinal cord injury (HCC) 05/21/2019    Past Surgical History:  Procedure Laterality Date   ANTERIOR CERVICAL DECOMP/DISCECTOMY FUSION N/A 05/21/2019   Procedure: Cervical five - six anterior cervical discectomy with interbody fusion, open reduction of spinal fracture dislocation;  Surgeon: Julio Sicks, MD;  Location: [facility] OR;  Service: Neurosurgery;  Laterality: N/A;       Home Medications    Prior to Admission medications   Not on File    Family History No family history on file.  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   Review of Systems Review of Systems  Constitutional:  Positive for fatigue. Negative for chills and fever.  HENT:  Positive for rhinorrhea. Negative for ear pain, sinus pressure and sore throat.   Eyes: Negative.   Respiratory:  Negative for cough.   Cardiovascular: Negative.   Gastrointestinal:  Positive for diarrhea. Negative for nausea and vomiting.  Genitourinary: Negative.     Physical Exam Triage  Vital Signs ED Triage Vitals [01/03/22 1449]  Enc Vitals Group     BP 124/86     Pulse Rate 86     Resp 16     Temp 99 F (37.2 C)     Temp Source Oral     SpO2 98 %     Weight      Height      Head Circumference      Peak Flow      Pain Score      Pain Loc      Pain Edu?      Excl. in GC?    No data found.  Updated Vital Signs BP 124/86 (BP Location: Right Arm)    Pulse 86    Temp 99 F (37.2 C) (Oral)    Resp 16    SpO2 98%   Visual Acuity Right Eye Distance:   Left Eye Distance:   Bilateral Distance:    Right Eye Near:   Left Eye Near:    Bilateral Near:     Physical Exam Constitutional:      Appearance: Normal appearance.  HENT:     Head: Normocephalic and atraumatic.     Nose: Nose normal. No congestion or rhinorrhea.     Mouth/Throat:     Mouth: Mucous membranes are moist.  Pharynx: No oropharyngeal exudate or posterior oropharyngeal erythema.  Cardiovascular:     Rate and Rhythm: Normal rate and regular rhythm.  Pulmonary:     Effort: Pulmonary effort is normal.     Breath sounds: Normal breath sounds.  Abdominal:     General: Bowel sounds are normal.     Palpations: Abdomen is soft.     Tenderness: There is no abdominal tenderness.  Musculoskeletal:        General: Normal range of motion.     Cervical back: Normal range of motion and neck supple. No tenderness.  Lymphadenopathy:     Cervical: No cervical adenopathy.  Skin:    General: Skin is warm.  Neurological:     General: No focal deficit present.     Mental Status: He is alert.     UC Treatments / Results  Labs (all labs ordered are listed, but only abnormal results are displayed) Labs Reviewed  SARS CORONAVIRUS 2 (TAT 6-24 HRS)  POC INFLUENZA A AND B ANTIGEN (URGENT CARE ONLY)    EKG   Radiology No results found.  Procedures Procedures (including critical care time)  Medications Ordered in UC Medications - No data to display  Initial Impression / Assessment and Plan  / UC Course  I have reviewed the triage vital signs and the nursing notes.  Pertinent labs & imaging results that were available during my care of the patient were reviewed by me and considered in my medical decision making (see chart for details).   Patient was seen for flu like symptoms.  Flu negative, covid swab pending.  Supportive care at this time.   Final Clinical Impressions(s) / UC Diagnoses   Final diagnoses:  Viral upper respiratory illness  Diarrhea, unspecified type  Body aches     Discharge Instructions      You were seen today for flu like illness.  Your flu swab was negative today.  As a result, you were swabbed for covid, and this will be resulted tomorrow.   In the mean time I recommend supportive care, such as tylenol or motrin for body aches and fever, plenty of rest and fluids;   Please follow up if not improving over time with your primary care provider.     ED Prescriptions   None    PDMP not reviewed this encounter.   Rondel Oh, MD 01/03/22 1526

## 2022-01-03 NOTE — Discharge Instructions (Addendum)
You were seen today for flu like illness.  Your flu swab was negative today.  As a result, you were swabbed for covid, and this will be resulted tomorrow.   In the mean time I recommend supportive care, such as tylenol or motrin for body aches and fever, plenty of rest and fluids;   Please follow up if not improving over time with your primary care provider.

## 2022-01-03 NOTE — ED Triage Notes (Signed)
For 3 days had headache, fatigue, cough, congestion, sore throat, diarrhea. Home covid was negative.

## 2022-01-04 LAB — SARS CORONAVIRUS 2 (TAT 6-24 HRS): SARS Coronavirus 2: NEGATIVE

## 2022-05-06 ENCOUNTER — Encounter (HOSPITAL_BASED_OUTPATIENT_CLINIC_OR_DEPARTMENT_OTHER): Payer: Self-pay | Admitting: Obstetrics and Gynecology

## 2022-05-06 ENCOUNTER — Other Ambulatory Visit: Payer: Self-pay

## 2022-05-06 ENCOUNTER — Emergency Department (HOSPITAL_BASED_OUTPATIENT_CLINIC_OR_DEPARTMENT_OTHER)
Admission: EM | Admit: 2022-05-06 | Discharge: 2022-05-06 | Disposition: A | Discharge status: Home / Self Care | Payer: Self-pay | Attending: Emergency Medicine | Admitting: Emergency Medicine

## 2022-05-06 DIAGNOSIS — Z20822 Contact with and (suspected) exposure to covid-19: Secondary | ICD-10-CM | POA: Insufficient documentation

## 2022-05-06 DIAGNOSIS — R519 Headache, unspecified: Secondary | ICD-10-CM

## 2022-05-06 DIAGNOSIS — J029 Acute pharyngitis, unspecified: Secondary | ICD-10-CM | POA: Insufficient documentation

## 2022-05-06 LAB — RESP PANEL BY RT-PCR (FLU A&B, COVID) ARPGX2
Influenza A by PCR: NEGATIVE
Influenza B by PCR: NEGATIVE
SARS Coronavirus 2 by RT PCR: NEGATIVE

## 2022-05-06 LAB — GROUP A STREP BY PCR: Group A Strep by PCR: NOT DETECTED

## 2022-05-06 MED ORDER — ACETAMINOPHEN 325 MG PO TABS
650.0000 mg | ORAL_TABLET | Freq: Once | ORAL | Status: AC
  Administered 2022-05-06: 650 mg via ORAL
  Filled 2022-05-06: qty 2

## 2022-05-06 MED ORDER — KETOROLAC TROMETHAMINE 15 MG/ML IJ SOLN
15.0000 mg | Freq: Once | INTRAMUSCULAR | Status: AC
  Administered 2022-05-06: 15 mg via INTRAMUSCULAR
  Filled 2022-05-06: qty 1

## 2022-05-06 NOTE — Discharge Instructions (Signed)
Please read and follow all provided instructions. ? ?Your diagnoses today include:  ?1. Pharyngitis, unspecified etiology   ?2. Acute nonintractable headache, unspecified headache type   ? ? ?Tests performed today include: ?Strep test: was negative for strep throat ?COVID/flu testing: Were negative ?Vital signs. See below for your results today.  ? ?Medications prescribed:  ?Please use over-the-counter NSAID medications (ibuprofen, naproxen) as directed on the packaging for pain if you do not have any reasons not to take these medications just as weak kidneys or a history of bleeding in your stomach or gut.  ? ?Home care instructions:  ?Please read the educational materials provided and follow any instructions contained in this packet. ? ?Follow-up instructions: ?Please follow-up with your primary care provider as needed for further evaluation of your symptoms. ? ?Return instructions:  ?Please return to the Emergency Department if you experience worsening symptoms.  ?Return with worsening severe headache, confusion, vomiting, trouble walking, or severe neck pain. ?Return if you have worsening problems swallowing, your neck becomes swollen, you cannot swallow your saliva or your voice becomes muffled.  ?Return with high persistent fever, persistent vomiting, or if you have trouble breathing.  ?Please return if you have any other emergent concerns. ? ?Additional Information: ? ?Your vital signs today were: ?BP 128/77 (BP Location: Right Arm)   Pulse 86   Temp (!) 101.2 ?F (38.4 ?C)   Resp 16   Ht 5\' 10"  (1.778 m)   Wt 63.5 kg   SpO2 97%   BMI 20.09 kg/m?  ?If your blood pressure (BP) was elevated above 135/85 this visit, please have this repeated by your doctor within one month. ?-------------- ? ?

## 2022-05-06 NOTE — ED Triage Notes (Signed)
Patient reports to the ER for headache that will not go away x2 days. Patient reports he is also having chills and fatigue.  ?

## 2022-05-06 NOTE — ED Notes (Signed)
Patient verbalizes understanding of discharge instructions. Opportunity for questioning and answers were provided. Patient discharged from ED.  °

## 2022-05-06 NOTE — ED Provider Notes (Signed)
?Stephen Gutierrez ?Provider Note ? ? ?CSN: [id number] ?Arrival date & time: 05/06/22  1429 ? ?  ? ?History ? ?Chief Complaint  ?Patient presents with  ? Headache  ? Fever  ? ? ?Stephen Gutierrez is a 31 y.o. male. ? ?Patient with no significant past medical history other than cervical spine surgery -- presents to the emergency department today for evaluation of headache and sore throat.  Symptoms started about 2 days ago.  He reports that his daughter was sick with strep throat about a week ago.  Symptoms started with a headache.  Headache frontal, worse with bending over.  No confusion, vomiting, neck pain or back pain.  No weakness, numbness, or tingling in the arms of the legs.  No skin rashes.  He has been able to swallow but it is painful.  No cough.  He has felt more down because of the symptoms.  No treatments prior to arrival.  He did not know that he had a fever until he presented to the emergency department today where his temperature was 101.2 ?F. ? ? ?  ? ?Home Medications ?Prior to Admission medications   ?Not on File  ?   ? ?Allergies    ?Patient has no known allergies.   ? ?Review of Systems   ?Review of Systems ? ?Physical Exam ?Updated Vital Signs ?BP 128/77 (BP Location: Right Arm)   Pulse 65   Temp 98.3 ?F (36.8 ?C) (Oral)   Resp 16   Ht 5\' 10"  (1.778 m)   Wt 63.5 kg   SpO2 98%   BMI 20.09 kg/m?  ?Physical Exam ?Vitals and nursing note reviewed.  ?Constitutional:   ?   Appearance: He is well-developed.  ?HENT:  ?   Head: Normocephalic and atraumatic.  ?   Right Ear: Tympanic membrane, ear canal and external ear normal.  ?   Left Ear: Tympanic membrane, ear canal and external ear normal.  ?   Nose: Nose normal.  ?   Mouth/Throat:  ?   Pharynx: Uvula midline. Posterior oropharyngeal erythema present. No oropharyngeal exudate.  ?Eyes:  ?   General: Lids are normal.  ?   Conjunctiva/sclera: Conjunctivae normal.  ?   Pupils: Pupils are equal, round, and reactive to light.   ?Neck:  ?   Comments: No meningeal signs. ?Cardiovascular:  ?   Rate and Rhythm: Normal rate and regular rhythm.  ?Pulmonary:  ?   Effort: Pulmonary effort is normal.  ?   Breath sounds: Normal breath sounds.  ?Abdominal:  ?   Palpations: Abdomen is soft.  ?   Tenderness: There is no abdominal tenderness.  ?Musculoskeletal:     ?   General: Normal range of motion.  ?   Cervical back: Normal range of motion and neck supple. No tenderness or bony tenderness.  ?Skin: ?   General: Skin is warm and dry.  ?Neurological:  ?   Mental Status: He is alert and oriented to person, place, and time.  ?   GCS: GCS eye subscore is 4. GCS verbal subscore is 5. GCS motor subscore is 6.  ?   Cranial Nerves: No cranial nerve deficit.  ?   Sensory: No sensory deficit.  ?   Motor: No abnormal muscle tone.  ?   Coordination: Coordination normal.  ?   Gait: Gait normal.  ? ? ?ED Results / Procedures / Treatments   ?Labs ?(all labs ordered are listed, but only abnormal results are displayed) ?Labs Reviewed  ?  GROUP A STREP BY PCR  ?RESP PANEL BY RT-PCR (FLU A&B, COVID) [id number]  ? ? ?EKG ?None ? ?Radiology ?No results found. ? ?Procedures ?Procedures  ? ? ?Medications Ordered in ED ?Medications  ?acetaminophen (TYLENOL) tablet 650 mg (650 mg Oral Given 05/06/22 1440)  ?ketorolac (TORADOL) 15 MG/ML injection 15 mg (15 mg Intramuscular Given 05/06/22 1715)  ? ? ?ED Course/ Medical Decision Making/ A&P ?  ? ?Patient seen and examined. History obtained directly from patient. Work-up including labs, imaging, EKG ordered in triage, if performed, were reviewed.   ? ?Labs/EKG: Independently reviewed and interpreted.  This included: Negative strep, negative flu/COVID. ? ?Imaging: None ordered ? ?Medications/Fluids: Ordered IM Toradol 15 mg. ? ?Most recent vital signs reviewed and are as follows: ?BP 128/77 (BP Location: Right Arm)   Pulse 65   Temp 98.3 ?F (36.8 ?C) (Oral)   Resp 16   Ht 5\' 10"  (1.778 m)   Wt 63.5 kg   SpO2 98%   BMI 20.09 kg/m?   ? ?Initial impression: Pharyngitis with fever and headache ? ?Home treatment plan: Rest, OTC meds, maintain good hydration ? ?Return instructions discussed with patient: We did discuss signs and symptoms of meningitis but feel that he is at low risk for bacterial meningitis today.  Encourage patient to return with worsening severe headache, vomiting, confusion, difficulty with walking, weakness. ? ?Follow-up instructions discussed with patient: Encourage PCP follow-up in 3 days if not improving. ? ? ?                        ?Medical Decision Making ?Risk ?OTC drugs. ?Prescription drug management. ? ? ? ?Patient presents to the emergency department today with fever and sore throat, also noted to have fever.  He looks well.  Strep throat contact recently, negative strep here.  Negative flu and COVID.  Will treat symptomatically.  Will have him stay home and rest for the next couple of days. ? ?We did discuss typical presentations for bacterial and viral meningitis.  Discussed that we cannot rule out possible viral meningitis, however he looks exceedingly well today and risks of lumbar puncture are likely not worth the benefit at this time.  Patient is in agreement.  We did discuss strict return instructions if symptoms do get worse. ? ?In regards to the patient's headache, critical differentials were considered including subarachnoid hemorrhage, intracerebral hemorrhage, epidural/subdural hematoma, pituitary apoplexy, vertebral/carotid artery dissection, giant cell arteritis, central venous thrombosis, reversible cerebral vasoconstriction, acute angle closure glaucoma, idiopathic intracranial hypertension, bacterial meningitis, viral encephalitis, carbon monoxide poisoning, posterior reversible encephalopathy syndrome.  ? ?Reg flag symptoms related to these causes were considered including systemic symptoms (fever, weight loss), neurologic symptoms (confusion, mental status change, vision change, associated seizure),  acute or sudden "thunderclap" onset, patient age 78 or older with new or progressive headache, patient of any age with first headache or change in headache pattern, pregnant or postpartum status, history of HIV or other immunocompromise, history of cancer, headache occurring with exertion, associated neck or shoulder pain, associated traumatic injury, concurrent use of anticoagulation, family history of spontaneous SAH.   ? ?Other benign, more common causes of headache were considered including migraine, tension-type headache, cluster headache, referred pain from other cause such as sinus infection, dental pain, trigeminal neuralgia.  ? ?On exam, patient has a reassuring neuro exam including baseline mental status, no significant neck pain or meningeal signs, no signs of severe infection or fever.  ? ?The patient's vital  signs, pertinent lab work and imaging were reviewed and interpreted as discussed in the ED course. Hospitalization was considered for further testing, treatments, or serial exams/observation. However as patient is well-appearing, has a stable exam over the course of their evaluation, and reassuring studies today, I do not feel that they warrant admission at this time. This plan was discussed with the patient who verbalizes agreement and comfort with this plan and seems reliable and able to return to the Emergency Department with worsening or changing symptoms.  ? ? ? ? ? ? ? ? ?Final Clinical Impression(s) / ED Diagnoses ?Final diagnoses:  ?Pharyngitis, unspecified etiology  ?Acute nonintractable headache, unspecified headache type  ? ? ?Rx / DC Orders ?ED Discharge Orders   ? ? None  ? ?  ? ? ?  ?Carlisle Cater, PA-C ?05/06/22 1727 ? ?  ?Blanchie Dessert, MD ?05/07/22 2150 ? ?

## 2023-11-15 ENCOUNTER — Encounter (HOSPITAL_COMMUNITY): Payer: Self-pay | Admitting: Emergency Medicine

## 2023-11-15 ENCOUNTER — Emergency Department (HOSPITAL_COMMUNITY)
Admission: EM | Admit: 2023-11-15 | Discharge: 2023-11-15 | Discharge status: Against Medical Advice | Payer: Self-pay | Attending: Emergency Medicine | Admitting: Emergency Medicine

## 2023-11-15 DIAGNOSIS — Z5321 Procedure and treatment not carried out due to patient leaving prior to being seen by health care provider: Secondary | ICD-10-CM | POA: Insufficient documentation

## 2023-11-15 DIAGNOSIS — L0201 Cutaneous abscess of face: Secondary | ICD-10-CM | POA: Insufficient documentation

## 2023-11-15 NOTE — ED Triage Notes (Signed)
Pt here from home with c/o an ingrown hair to the left side of his face , pt tried to pop it himself but was unable to get anything out of it

## 2023-11-15 NOTE — ED Notes (Signed)
Pt left AMA °
